# Patient Record
Sex: Male | Born: 1935 | Race: White | Hispanic: No | Smoking: Never smoker
Health system: Southern US, Community
[De-identification: ages and names within clinical notes are randomized; demographics above are authoritative.]

## PROBLEM LIST (undated history)

## (undated) DIAGNOSIS — I519 Heart disease, unspecified: Secondary | ICD-10-CM

## (undated) DIAGNOSIS — N289 Disorder of kidney and ureter, unspecified: Secondary | ICD-10-CM

## (undated) DIAGNOSIS — E785 Hyperlipidemia, unspecified: Secondary | ICD-10-CM

## (undated) DIAGNOSIS — I639 Cerebral infarction, unspecified: Secondary | ICD-10-CM

## (undated) DIAGNOSIS — B019 Varicella without complication: Secondary | ICD-10-CM

## (undated) DIAGNOSIS — J45909 Unspecified asthma, uncomplicated: Secondary | ICD-10-CM

## (undated) DIAGNOSIS — N200 Calculus of kidney: Secondary | ICD-10-CM

## (undated) DIAGNOSIS — E039 Hypothyroidism, unspecified: Secondary | ICD-10-CM

## (undated) DIAGNOSIS — G4730 Sleep apnea, unspecified: Secondary | ICD-10-CM

## (undated) DIAGNOSIS — K8020 Calculus of gallbladder without cholecystitis without obstruction: Secondary | ICD-10-CM

## (undated) DIAGNOSIS — I10 Essential (primary) hypertension: Secondary | ICD-10-CM

## (undated) HISTORY — DX: Heart disease, unspecified: I51.9

## (undated) HISTORY — DX: Calculus of gallbladder without cholecystitis without obstruction: K80.20

## (undated) HISTORY — DX: Unspecified asthma, uncomplicated: J45.909

## (undated) HISTORY — DX: Disorder of kidney and ureter, unspecified: N28.9

## (undated) HISTORY — DX: Sleep apnea, unspecified: G47.30

## (undated) HISTORY — DX: Cerebral infarction, unspecified: I63.9

## (undated) HISTORY — DX: Varicella without complication: B01.9

## (undated) HISTORY — DX: Hyperlipidemia, unspecified: E78.5

## (undated) HISTORY — DX: Hypothyroidism, unspecified: E03.9

## (undated) HISTORY — DX: Essential (primary) hypertension: I10

## (undated) HISTORY — PX: TONSILLECTOMY: SUR1361

## (undated) HISTORY — DX: Calculus of kidney: N20.0

---

## 2003-09-27 ENCOUNTER — Emergency Department (HOSPITAL_COMMUNITY): Admission: EM | Admit: 2003-09-27 | Discharge: 2003-09-27 | Payer: Self-pay | Admitting: Family Medicine

## 2003-11-27 ENCOUNTER — Emergency Department (HOSPITAL_COMMUNITY): Admission: EM | Admit: 2003-11-27 | Discharge: 2003-11-27 | Payer: Self-pay | Admitting: Family Medicine

## 2004-12-17 ENCOUNTER — Emergency Department (HOSPITAL_COMMUNITY): Admission: EM | Admit: 2004-12-17 | Discharge: 2004-12-17 | Payer: Self-pay | Admitting: Emergency Medicine

## 2006-05-30 ENCOUNTER — Emergency Department (HOSPITAL_COMMUNITY): Admission: EM | Admit: 2006-05-30 | Discharge: 2006-05-30 | Payer: Self-pay | Admitting: Family Medicine

## 2006-07-06 ENCOUNTER — Emergency Department (HOSPITAL_COMMUNITY): Admission: EM | Admit: 2006-07-06 | Discharge: 2006-07-06 | Payer: Self-pay | Admitting: Family Medicine

## 2006-07-17 ENCOUNTER — Emergency Department (HOSPITAL_COMMUNITY): Admission: EM | Admit: 2006-07-17 | Discharge: 2006-07-17 | Payer: Self-pay | Admitting: Family Medicine

## 2008-04-15 ENCOUNTER — Emergency Department (HOSPITAL_COMMUNITY): Admission: EM | Admit: 2008-04-15 | Discharge: 2008-04-15 | Payer: Self-pay | Admitting: Emergency Medicine

## 2008-07-13 ENCOUNTER — Emergency Department (HOSPITAL_COMMUNITY): Admission: EM | Admit: 2008-07-13 | Discharge: 2008-07-13 | Payer: Self-pay | Admitting: Emergency Medicine

## 2008-08-23 ENCOUNTER — Emergency Department (HOSPITAL_COMMUNITY): Admission: EM | Admit: 2008-08-23 | Discharge: 2008-08-23 | Payer: Self-pay | Admitting: Family Medicine

## 2008-11-23 ENCOUNTER — Emergency Department (HOSPITAL_COMMUNITY): Admission: EM | Admit: 2008-11-23 | Discharge: 2008-11-23 | Payer: Self-pay | Admitting: Emergency Medicine

## 2010-07-07 LAB — POCT RAPID STREP A (OFFICE): Streptococcus, Group A Screen (Direct): NEGATIVE

## 2010-08-14 NOTE — H&P (Signed)
NAME:  Dale Munoz, Dale Munoz                ACCOUNT NO.:  [id number]   MEDICAL RECORD NO.:  [id number]          PATIENT TYPE:  EMS   LOCATION:  [facility]                         FACILITY:  [facility]   PHYSICIAN:  Dale Munoz, M.D.DATE OF BIRTH:  1924-08-29   DATE OF ADMISSION:  12/17/2004  DATE OF DISCHARGE:                                HISTORY & PHYSICAL   CHIEF COMPLAINT:  Left forearm weakness.   HISTORY OF PRESENTING COMPLAINT:  Dale Munoz is a 77 year old Caucasian male,  __________ elderly; not on an medications.  This morning about 9 a.m. he was  making toast for himself, and while trying to spread peanut butter on the  toast he noticed he was clumsy and his left forearm was not working alright.  He went to Dr. Mellody Munoz office and the patient was sent for MRI.  Results  were called to Dr. Mellody Munoz' office, and the patient was subsequently  informed to come over to the emergency department.  I do not have the full  report of the MRI results at this time.  Attempts at obtaining the results  from Dale Munoz was unsuccessful, as the results have not been dictated.  In any case, my understanding is that he was noted to have acute stroke on  the MRI Munoz, and he was referred to emergency room for further  evaluation.   At this time Dale Munoz is very unwilling to be admitted for full workup of  TIA.  He has regained full strength in his left forearm.  At this point he  does not have any further neurological deficits.   REVIEW OF SYSTEMS:  No chest pain, shortness of breath, headaches, abdominal  pain.   PAST MEDICAL HISTORY:  None.   MEDICATIONS:  None.   ALLERGIES:  PENICILLIN.   FAMILY HISTORY:  No known family history of MI, stroke or diabetes.  Father  died at the age of 2.  Mother died at the age of 26. One brother died at  the age of 20.  He has a younger brother who is alive and healthy.   He is a widower and has 1 daughter that lives in [place].  One son passed  away  in his early 4's from drug overdose.   SOCIAL HISTORY:  He never smoked cigarettes.  Does not drink alcohol  significantly.  He is independent of activities of daily living.   PHYSICAL EXAMINATION:  VITALS:  Blood pressure 152/72, temperature 97.4,  pulse 65, respiratory rate 20, oxygen saturation 97% on room air.  GENERAL:  This is a fairly obese gentleman, in no respiratory distress.  HEENT:  Normocephalic and atraumatic.  Pupils are equal, round and reactive  to light.  Extraocular muscle movements intact.  NECK:  No carotid bruits.  No elevated JVD.  No supraclavicular nodules.  LUNGS:  Clear to auscultation.  CARDIOVASCULAR:  S1 and S2 regular and no murmur, gallop or rub.  ABDOMEN:  Obese, nontender.  No hepatosplenomegaly.  Bowel sounds present.  CNS:  Motor 5/5 in all limbs.  Cranial nerves intact; patient able to  ambulate  without any notable gait.  No cerebellar sign.  SKIN:  No rash.  No petechiae.   LABORATORY DATA:  CBC and differential:  White cells 6.6, hemoglobin 16.5,  hematocrit 46.3, platelets 262; normal differential.  Sodium 138, potassium  3.2, chloride 102, CO2 28, glucose 117, BUN 8, creatinine 0.9.  Bilirubin  0.9, alkaline phosphatase 90, AST 26, ALT 16, total protein 7.0, albumin  3.8, calcium 9.0.  PT 14.9, INR 1.2, PTT 30.  ESR 14.  Cardiac markers at  the point of care are normal.  Urinalysis normal.   EKG:  Normal sinus rhythm, with PR-interval of 158.  Heart rate 67.  QRS  duration 129, mildly prolonged.  There is a question of delta wave on the  QRS complex, which is suggestive of Wolff-Parkinson-White (there is no old  EKG to compare).   ASSESSMENT AND PLAN:  Mr. Dale Munoz is a pleasant 77 year old Caucasian  male, who presents with left forearm weakness and symptoms that have  resolved completed.  MRI reported showed acute stroke.   Dale Munoz is unwilling to be admitted tonight for further workup and risk  evaluation.  He would prefer to  follow up with Dale Munoz in the morning  tomorrow, and have Dale Munoz order necessary workup as indicated by the  full report of the MRI.   He has taken aspirin 1 tablet today, and I strongly advised him to continue  with aspirin once a day.  He will be signing out against medical advice. He  does understand the possible consequences of his actions, which includes  massive stroke within the next 24 hr.  The patient will be seeing Dr.  Doristine Munoz in the morning.      Dale B. Dale Munoz, M.D.  Electronically Signed     Dale Munoz  D:  12/17/2004  T:  12/17/2004  Job:  [id number]

## 2014-04-15 ENCOUNTER — Ambulatory Visit (INDEPENDENT_AMBULATORY_CARE_PROVIDER_SITE_OTHER): Payer: Medicare Other | Admitting: Family Medicine

## 2014-04-15 ENCOUNTER — Encounter: Payer: Self-pay | Admitting: Family Medicine

## 2014-04-15 VITALS — BP 139/55 | HR 61 | Temp 98.3°F | Ht 67.0 in | Wt 172.2 lb

## 2014-04-15 DIAGNOSIS — I6529 Occlusion and stenosis of unspecified carotid artery: Secondary | ICD-10-CM | POA: Insufficient documentation

## 2014-04-15 DIAGNOSIS — G4700 Insomnia, unspecified: Secondary | ICD-10-CM

## 2014-04-15 DIAGNOSIS — R609 Edema, unspecified: Secondary | ICD-10-CM

## 2014-04-15 DIAGNOSIS — I6523 Occlusion and stenosis of bilateral carotid arteries: Secondary | ICD-10-CM

## 2014-04-15 MED ORDER — SUVOREXANT 10 MG PO TABS: 1.0000 | ORAL_TABLET | Freq: Every day | ORAL | Status: DC

## 2014-04-15 NOTE — Progress Notes (Signed)
Pre visit review using our clinic review tool, if applicable. No additional management support is needed unless otherwise documented below in the visit note. 

## 2014-04-15 NOTE — Patient Instructions (Signed)
Insomnia Insomnia is frequent trouble falling and/or staying asleep. Insomnia can be a long term problem or a short term problem. Both are common. Insomnia can be a short term problem when the wakefulness is related to a certain stress or worry. Long term insomnia is often related to ongoing stress during waking hours and/or poor sleeping habits. Overtime, sleep deprivation itself can make the problem worse. Every little thing feels more severe because you are overtired and your ability to cope is decreased. CAUSES   Stress, anxiety, and depression.  Poor sleeping habits.  Distractions such as TV in the bedroom.  Naps close to bedtime.  Engaging in emotionally charged conversations before bed.  Technical reading before sleep.  Alcohol and other sedatives. They may make the problem worse. They can hurt normal sleep patterns and normal dream activity.  Stimulants such as caffeine for several hours prior to bedtime.  Pain syndromes and shortness of breath can cause insomnia.  Exercise late at night.  Changing time zones may cause sleeping problems (jet lag). It is sometimes helpful to have someone observe your sleeping patterns. They should look for periods of not breathing during the night (sleep apnea). They should also look to see how long those periods last. If you live alone or observers are uncertain, you can also be observed at a sleep clinic where your sleep patterns will be professionally monitored. Sleep apnea requires a checkup and treatment. Give your caregivers your medical history. Give your caregivers observations your family has made about your sleep.  SYMPTOMS   Not feeling rested in the morning.  Anxiety and restlessness at bedtime.  Difficulty falling and staying asleep. TREATMENT   Your caregiver may prescribe treatment for an underlying medical disorders. Your caregiver can give advice or help if you are using alcohol or other drugs for self-medication. Treatment  of underlying problems will usually eliminate insomnia problems.  Medications can be prescribed for short time use. They are generally not recommended for lengthy use.  Over-the-counter sleep medicines are not recommended for lengthy use. They can be habit forming.  You can promote easier sleeping by making lifestyle changes such as:  Using relaxation techniques that help with breathing and reduce muscle tension.  Exercising earlier in the day.  Changing your diet and the time of your last meal. No night time snacks.  Establish a regular time to go to bed.  Counseling can help with stressful problems and worry.  Soothing music and white noise may be helpful if there are background noises you cannot remove.  Stop tedious detailed work at least one hour before bedtime. HOME CARE INSTRUCTIONS   Keep a diary. Inform your caregiver about your progress. This includes any medication side effects. See your caregiver regularly. Take note of:  Times when you are asleep.  Times when you are awake during the night.  The quality of your sleep.  How you feel the next day. This information will help your caregiver care for you.  Get out of bed if you are still awake after 15 minutes. Read or do some quiet activity. Keep the lights down. Wait until you feel sleepy and go back to bed.  Keep regular sleeping and waking hours. Avoid naps.  Exercise regularly.  Avoid distractions at bedtime. Distractions include watching television or engaging in any intense or detailed activity like attempting to balance the household checkbook.  Develop a bedtime ritual. Keep a familiar routine of bathing, brushing your teeth, climbing into bed at the same   time each night, listening to soothing music. Routines increase the success of falling to sleep faster.  Use relaxation techniques. This can be using breathing and muscle tension release routines. It can also include visualizing peaceful scenes. You can  also help control troubling or intruding thoughts by keeping your mind occupied with boring or repetitive thoughts like the old concept of counting sheep. You can make it more creative like imagining planting one beautiful flower after another in your backyard garden.  During your day, work to eliminate stress. When this is not possible use some of the previous suggestions to help reduce the anxiety that accompanies stressful situations. MAKE SURE YOU:   Understand these instructions.  Will watch your condition.  Will get help right away if you are not doing well or get worse. Document Released: 03/12/2000 Document Revised: 06/07/2011 Document Reviewed: 04/12/2007 Exit[employer] Patient Information 2015 [employer]. This information is not intended to replace advice given to you by your health care provider. Make sure you discuss any questions you have with your health care provider.  

## 2014-04-15 NOTE — Progress Notes (Signed)
Subjective:    Patient ID: Dale Munoz, male    DOB: [birth year], 81 y.o.   MRN: [id number]  HPI Pt is here with his brother, Franko Hilliker to establish.  He fell in oct and nov and his R shoulder has hurt since.  He is unable to lift his arm past a certain point.  Pt states he did not pass out -- he tripped over his pants .    The second time he tripped over a brick.  He is now walking with a walking stick.  Pt also wants a new sleeping pill. He had been on [place] and ran out.    He is now taking 2 ambien and tylenol pm.  He has not had any work up done and does not want anything done.   He is also refusing referral for shoulder.  He sees [facility].  He will rto for cpe.    Past Medical History  Diagnosis Date  . Asthma   . Chicken pox   . Stroke   . Kidney disease   . Heart disease   . HTN (hypertension)   . Hyperlipemia    History   Social History  . Marital Status: Widowed    Spouse Name: N/A    Number of Children: N/A  . Years of Education: N/A   Occupational History  . Not on file.   Social History Main Topics  . Smoking status: Never Smoker   . Smokeless tobacco: Never Used  . Alcohol Use: No  . Drug Use: No  . Sexual Activity: Not on file   Other Topics Concern  . Not on file   Social History Narrative  . No narrative on file   Current Outpatient Prescriptions  Medication Sig Dispense Refill  . amiodarone (PACERONE) 200 MG tablet Take 100 mg by mouth daily.    Marland Kitchen amLODipine (NORVASC) 10 MG tablet Take 10 mg by mouth daily.    Marland Kitchen aspirin EC 81 MG tablet Take 81 mg by mouth daily.    . Cholecalciferol (VITAMIN D) 2000 UNITS CAPS Take by mouth.    . levothyroxine (SYNTHROID, LEVOTHROID) 50 MCG tablet Take 50 mcg by mouth daily before breakfast.    . Naproxen Sod-Diphenhydramine 220-25 MG TABS Take 2 tablets by mouth daily.    . pravastatin (PRAVACHOL) 40 MG tablet Take 40 mg by mouth daily.    Marland Kitchen torsemide (DEMADEX) 20 MG tablet Take 10 mg by mouth daily.     Marland Kitchen zolpidem (AMBIEN CR) 6.25 MG CR tablet Take 6.25 mg by mouth at bedtime as needed for sleep.    . Suvorexant (BELSOMRA) 10 MG TABS Take 1 tablet by mouth at bedtime. 10 tablet 0  . traZODone (DESYREL) 50 MG tablet Take 0.5-1 tablets (25-50 mg total) by mouth at bedtime as needed for sleep. 30 tablet 0   No current facility-administered medications for this visit.    Review of Systems  Constitutional: Negative for appetite change and fatigue.  HENT: Negative.  Negative for congestion.   Respiratory: Negative for cough, shortness of breath and wheezing.   Cardiovascular: Negative for chest pain and palpitations.  Musculoskeletal: Positive for joint swelling and arthralgias.       + right shoulder pain --- pt refusing referral  Neurological: Negative for tremors, syncope, speech difficulty, weakness and numbness.  Psychiatric/Behavioral: Positive for sleep disturbance. Negative for hallucinations, behavioral problems, confusion, dysphoric mood, decreased concentration and agitation. The patient is not nervous/anxious and is not  hyperactive.        Objective:   Physical Exam BP 139/55 mmHg  Pulse 61  Temp(Src) 98.3 F (36.8 C) (Oral)  Ht 5\' 7"  (1.702 m)  Wt 172 lb 3.2 oz (78.109 kg)  BMI 26.96 kg/m2  SpO2 95% General appearance: alert, cooperative, appears stated age and no distress Ears: normal TM's and external ear canals both ears Nose: Nares normal. Septum midline. Mucosa normal. No drainage or sinus tenderness. Throat: lips, mucosa, and tongue normal; teeth and gums normal Neck: no adenopathy, no JVD, supple, symmetrical, trachea midline, thyroid not enlarged, symmetric, no tenderness/mass/nodules and + carotid bruit  Lungs: clear to auscultation bilaterally Heart: S1, S2 normal Extremities: R shoulder pain -- unable to abduct past 90 degrees        Assessment & Plan:  1. Insomnia Pt willing to try belsomra D/w pt and brother that Remus Lofflerambien was not safe for hi at his  age  372. Edema con't torsemide Elevation F/u card  3. Carotid stenosis, bilateral Per cardiology

## 2014-04-16 ENCOUNTER — Telehealth: Payer: Self-pay | Admitting: Family Medicine

## 2014-04-16 MED ORDER — TRAZODONE HCL 50 MG PO TABS: 25.0000 mg | ORAL_TABLET | Freq: Every evening | ORAL | Status: DC | PRN

## 2014-04-16 NOTE — Telephone Encounter (Signed)
During our discussion yesterday ins did not want him to have ambien--and it is really not safe for his age group  trazadone 50 mg  1/2 -1 po qhs prn #30

## 2014-04-16 NOTE — Telephone Encounter (Signed)
Spoke with patient he verbalized understanding and has agreed to take the Trazadone. Rx faxed.      KP

## 2014-04-16 NOTE — Telephone Encounter (Signed)
Please advise      KP 

## 2014-04-16 NOTE — Telephone Encounter (Signed)
Caller name: Dale Munoz, Dale Munoz Relation to pt: self  Call back number: [phone]  Pharmacy:  [facility] - [place], [place] - [address] [phone] (Phone)     Reason for call:  Pt states Suvorexant (BELSOMRA) 10 MG TABS caused him to have sleepless night and made him delusional, pt requesting zolpidem tartrate 6.25mg . Please advise

## 2014-04-22 ENCOUNTER — Telehealth: Payer: Self-pay | Admitting: Family Medicine

## 2014-04-22 MED ORDER — ZOLPIDEM TARTRATE ER 6.25 MG PO TBCR: 6.2500 mg | EXTENDED_RELEASE_TABLET | Freq: Every evening | ORAL | Status: DC | PRN

## 2014-04-22 NOTE — Telephone Encounter (Signed)
ambien cr 6.25 mg #30  1 po qhs   0 refills We prefer he try not to take one every night--- take every 3-4 nights if possible

## 2014-04-22 NOTE — Telephone Encounter (Signed)
Spoke with patient and he said the Trazodone is not working, he said he has not had a good night sleep for awhile.He has tried tylenol PM with the Trazodone but nothing. He wants to know what to do and he is requesting 6.25 Ambien, he said this is what has worked for him in the past. Please advise      KP

## 2014-04-22 NOTE — Telephone Encounter (Signed)
Patient has been made aware and verbalized understanding, he has agreed to take every 3-4 night as directed. Rx faxed.     KP

## 2014-04-22 NOTE — Telephone Encounter (Signed)
Caller name:Dale Munoz Relationship to patient:self Can be reached:[phone] Pharmacy:[facility]  Reason for call: PT states that RX for sleep is not working- wants to speak with nurse regarding

## 2014-04-22 NOTE — Telephone Encounter (Signed)
Caller name:Danney Lorensen Relationship to patient:self Can be [phone]  Pharmacy: [facility]  Reason for call:PT wanting to speak with nurse regarding RX for sleep- not working.

## 2014-06-07 ENCOUNTER — Telehealth: Payer: Self-pay | Admitting: Family Medicine

## 2014-06-07 NOTE — Telephone Encounter (Signed)
Requesting:  zolpidem (AMBIEN CR) 6.25 MG CR tablet      Contract none UDS none Last OV scheduled CPE 3.17.16 Last Refill 1.25.16 #30 no refills  Please Advise Patient is requesting a 90 day supply be sent to mail order

## 2014-06-07 NOTE — Telephone Encounter (Signed)
Caller name: Allard Relation to VH:QIONpt:self Call back number: [phone] Pharmacy: [facility]  Reason for call:   Patient requesting a refill of zolpidem 6.25mg , pravastatin 40mg , torsemide 20mg , l-tayroxide 50mg , amiodarone 200mg , amlodipine 10mg . 90 day supply

## 2014-06-10 MED ORDER — LEVOTHYROXINE SODIUM 50 MCG PO TABS: 50.0000 ug | ORAL_TABLET | Freq: Every day | ORAL | Status: DC

## 2014-06-10 MED ORDER — AMLODIPINE BESYLATE 10 MG PO TABS: 10.0000 mg | ORAL_TABLET | Freq: Every day | ORAL | Status: DC

## 2014-06-10 MED ORDER — ZOLPIDEM TARTRATE ER 6.25 MG PO TBCR: 6.2500 mg | EXTENDED_RELEASE_TABLET | Freq: Every evening | ORAL | Status: DC | PRN

## 2014-06-10 MED ORDER — TORSEMIDE 20 MG PO TABS: 10.0000 mg | ORAL_TABLET | Freq: Every day | ORAL | Status: DC

## 2014-06-10 MED ORDER — AMIODARONE HCL 200 MG PO TABS: 100.0000 mg | ORAL_TABLET | Freq: Every day | ORAL | Status: DC

## 2014-06-10 MED ORDER — PRAVASTATIN SODIUM 40 MG PO TABS: 40.0000 mg | ORAL_TABLET | Freq: Every day | ORAL | Status: DC

## 2014-06-10 NOTE — Telephone Encounter (Signed)
Patient notified Rx at front desk and that he needs to fill out contract.  All other meds, but Ambien, sent to [facility].

## 2014-06-10 NOTE — Telephone Encounter (Signed)
Printed

## 2014-06-10 NOTE — Telephone Encounter (Signed)
Refill x1-- need uds and contract 

## 2014-06-13 ENCOUNTER — Encounter: Payer: Medicare Other | Admitting: Family Medicine

## 2014-06-19 ENCOUNTER — Telehealth: Payer: Self-pay

## 2014-06-19 NOTE — Telephone Encounter (Signed)
See speciality notes 

## 2014-06-20 ENCOUNTER — Ambulatory Visit (INDEPENDENT_AMBULATORY_CARE_PROVIDER_SITE_OTHER): Payer: Medicare Other | Admitting: Family Medicine

## 2014-06-20 ENCOUNTER — Encounter: Payer: Self-pay | Admitting: Family Medicine

## 2014-06-20 VITALS — BP 122/58 | HR 44 | Temp 97.0°F | Ht 67.0 in | Wt 170.2 lb

## 2014-06-20 DIAGNOSIS — I6523 Occlusion and stenosis of bilateral carotid arteries: Secondary | ICD-10-CM | POA: Diagnosis not present

## 2014-06-20 DIAGNOSIS — G4700 Insomnia, unspecified: Secondary | ICD-10-CM

## 2014-06-20 DIAGNOSIS — R350 Frequency of micturition: Secondary | ICD-10-CM | POA: Diagnosis not present

## 2014-06-20 DIAGNOSIS — I10 Essential (primary) hypertension: Secondary | ICD-10-CM

## 2014-06-20 DIAGNOSIS — E039 Hypothyroidism, unspecified: Secondary | ICD-10-CM

## 2014-06-20 DIAGNOSIS — E785 Hyperlipidemia, unspecified: Secondary | ICD-10-CM | POA: Diagnosis not present

## 2014-06-20 DIAGNOSIS — Z0000 Encounter for general adult medical examination without abnormal findings: Secondary | ICD-10-CM | POA: Diagnosis not present

## 2014-06-20 LAB — HEPATIC FUNCTION PANEL
ALBUMIN: 4 g/dL (ref 3.5–5.2)
ALK PHOS: 241 U/L — AB (ref 39–117)
ALT: 21 U/L (ref 0–53)
AST: 32 U/L (ref 0–37)
Bilirubin, Direct: 0.3 mg/dL (ref 0.0–0.3)
Total Bilirubin: 1 mg/dL (ref 0.2–1.2)
Total Protein: 7.2 g/dL (ref 6.0–8.3)

## 2014-06-20 LAB — CBC WITH DIFFERENTIAL/PLATELET
BASOS PCT: 0.8 % (ref 0.0–3.0)
Basophils Absolute: 0.1 10*3/uL (ref 0.0–0.1)
EOS ABS: 0.4 10*3/uL (ref 0.0–0.7)
Eosinophils Relative: 4.9 % (ref 0.0–5.0)
HEMATOCRIT: 39.2 % (ref 39.0–52.0)
HEMOGLOBIN: 13.2 g/dL (ref 13.0–17.0)
Lymphocytes Relative: 15.9 % (ref 12.0–46.0)
Lymphs Abs: 1.4 10*3/uL (ref 0.7–4.0)
MCHC: 33.7 g/dL (ref 30.0–36.0)
MCV: 95.4 fl (ref 78.0–100.0)
MONO ABS: 0.7 10*3/uL (ref 0.1–1.0)
Monocytes Relative: 8.5 % (ref 3.0–12.0)
NEUTROS ABS: 6.2 10*3/uL (ref 1.4–7.7)
Neutrophils Relative %: 69.9 % (ref 43.0–77.0)
Platelets: 277 10*3/uL (ref 150.0–400.0)
RBC: 4.1 Mil/uL — AB (ref 4.22–5.81)
RDW: 14.2 % (ref 11.5–15.5)
WBC: 8.8 10*3/uL (ref 4.0–10.5)

## 2014-06-20 LAB — BASIC METABOLIC PANEL
BUN: 24 mg/dL — AB (ref 6–23)
CALCIUM: 9.1 mg/dL (ref 8.4–10.5)
CHLORIDE: 102 meq/L (ref 96–112)
CO2: 27 mEq/L (ref 19–32)
Creatinine, Ser: 1.55 mg/dL — ABNORMAL HIGH (ref 0.40–1.50)
GFR: 45.26 mL/min — AB (ref >60.00)
GLUCOSE: 94 mg/dL (ref 70–99)
POTASSIUM: 3.6 meq/L (ref 3.5–5.1)
SODIUM: 138 meq/L (ref 135–145)

## 2014-06-20 LAB — TSH: TSH: 3.6 u[IU]/mL (ref 0.35–4.50)

## 2014-06-20 LAB — LIPID PANEL
Cholesterol: 191 mg/dL (ref 0–200)
HDL: 43.9 mg/dL (ref >39.00)
LDL Cholesterol: 120 mg/dL — ABNORMAL HIGH (ref 0–99)
NONHDL: 147.1
Total CHOL/HDL Ratio: 4
Triglycerides: 137 mg/dL (ref 0.0–149.0)
VLDL: 27.4 mg/dL (ref 0.0–40.0)

## 2014-06-20 LAB — PSA: PSA: 0.59 ng/mL (ref 0.10–4.00)

## 2014-06-20 NOTE — Progress Notes (Signed)
Subjective:   Dale Munoz is a 81 y.o. male who presents for Medicare Annual/Subsequent preventive examination.  Review of Systems:   Review of Systems  Constitutional: Negative for activity change, appetite change and fatigue.  HENT: Negative for, congestion, tinnitus and ear discharge.  + hearing loss but will not use hearing age. Eyes: Negative for visual disturbance (opth-no)  Respiratory: Negative for cough, chest tightness and shortness of breath.   Cardiovascular: Negative for chest pain, palpitations and leg swelling.  Gastrointestinal: Negative for abdominal pain, diarrhea, constipation and abdominal distention.  Genitourinary: Negative for urgency, frequency, decreased urine volume and difficulty urinating.  Musculoskeletal: Negative for back pain, arthralgias and gait problem.  Skin: Negative for color change, pallor and rash.  Neurological: Negative for dizziness, light-headedness, numbness and headaches.  Hematological: Negative for adenopathy. Does not bruise/bleed easily.  Psychiatric/Behavioral: Negative for suicidal ideas, confusion, sleep disturbance, self-injury, dysphoric mood, decreased concentration and agitation.  Pt is able to read and write and can do all ADLs No risk for falling No abuse/ violence in home       Past Medical History  Diagnosis Date  . Asthma   . Chicken pox   . Stroke   . Kidney disease   . Heart disease   . HTN (hypertension)   . Hyperlipemia    History   Social History  . Marital Status: Widowed    Spouse Name: N/A  . Number of Children: N/A  . Years of Education: N/A   Occupational History  . Not on file.   Social History Main Topics  . Smoking status: Never Smoker   . Smokeless tobacco: Never Used  . Alcohol Use: No  . Drug Use: No  . Sexual Activity: Not on file   Other Topics Concern  . Not on file   Social History Narrative   Current Outpatient Prescriptions  Medication Sig Dispense Refill  .  amiodarone (PACERONE) 200 MG tablet Take 0.5 tablets (100 mg total) by mouth daily. 90 tablet 1  . amLODipine (NORVASC) 10 MG tablet Take 1 tablet (10 mg total) by mouth daily. 90 tablet 1  . aspirin EC 81 MG tablet Take 81 mg by mouth daily.    . Cholecalciferol (VITAMIN D) 2000 UNITS CAPS Take by mouth.    . levothyroxine (SYNTHROID, LEVOTHROID) 50 MCG tablet Take 1 tablet (50 mcg total) by mouth daily before breakfast. 90 tablet 1  . pravastatin (PRAVACHOL) 40 MG tablet Take 1 tablet (40 mg total) by mouth daily. 90 tablet 1  . torsemide (DEMADEX) 20 MG tablet Take 0.5 tablets (10 mg total) by mouth daily. 45 tablet 1  . zolpidem (AMBIEN CR) 6.25 MG CR tablet Take 1 tablet (6.25 mg total) by mouth at bedtime as needed for sleep. 90 tablet 0   No current facility-administered medications for this visit.   Not on File History reviewed. No pertinent family history.      Objective:    Vitals: BP 122/58 mmHg  Pulse 44  Temp(Src) 97 F (36.1 C) (Oral)  Ht 5\' 7"  (1.702 m)  Wt 170 lb 3.2 oz (77.202 kg)  BMI 26.65 kg/m2  SpO2 97% BP 122/58 mmHg  Pulse 44  Temp(Src) 97 F (36.1 C) (Oral)  Ht 5\' 7"  (1.702 m)  Wt 170 lb 3.2 oz (77.202 kg)  BMI 26.65 kg/m2  SpO2 97% General appearance: alert, cooperative, appears stated age and no distress Head: Normocephalic, without obvious abnormality, atraumatic Eyes: conjunctivae/corneas clear. PERRL, EOM's  intact. Fundi benign. Ears: normal TM's and external ear canals both ears Nose: Nares normal. Septum midline. Mucosa normal. No drainage or sinus tenderness. Throat: lips, mucosa, and tongue normal; teeth and gums normal Neck: no adenopathy, no carotid bruit, no JVD, supple, symmetrical, trachea midline and thyroid not enlarged, symmetric, no tenderness/mass/nodules Back: symmetric, no curvature. ROM normal. No CVA tenderness. Lungs: clear to auscultation bilaterally Chest wall: no tenderness Heart: S1, S2 normal and + murmur Abdomen: soft,  non-tender; bowel sounds normal; no masses,  no organomegaly Male genitalia: deferred-- pt refused Rectal: deferred--pt refused Extremities: edema +1 pitting edema Low right ext Pulses: 2+ and symmetric Skin: Skin color, texture, turgor normal. No rashes or lesions Lymph nodes: Cervical, supraclavicular, and axillary nodes normal. Neurologic: Alert and oriented X 3, normal strength and tone. Normal symmetric reflexes. Normal coordination and gait Psych- no depression, no anxiety Tobacco History  Smoking status  . Never Smoker   Smokeless tobacco  . Never Used     Counseling given: Not Answered   Past Medical History  Diagnosis Date  . Asthma   . Chicken pox   . Stroke   . Kidney disease   . Heart disease   . HTN (hypertension)   . Hyperlipemia    Past Surgical History  Procedure Laterality Date  . Tonsillectomy     History reviewed. No pertinent family history. History  Sexual Activity  . Sexual Activity: Not on file    Outpatient Encounter Prescriptions as of 06/20/2014  Medication Sig  . amiodarone (PACERONE) 200 MG tablet Take 0.5 tablets (100 mg total) by mouth daily.  Marland Kitchen amLODipine (NORVASC) 10 MG tablet Take 1 tablet (10 mg total) by mouth daily.  Marland Kitchen aspirin EC 81 MG tablet Take 81 mg by mouth daily.  . Cholecalciferol (VITAMIN D) 2000 UNITS CAPS Take by mouth.  . levothyroxine (SYNTHROID, LEVOTHROID) 50 MCG tablet Take 1 tablet (50 mcg total) by mouth daily before breakfast.  . pravastatin (PRAVACHOL) 40 MG tablet Take 1 tablet (40 mg total) by mouth daily.  Marland Kitchen torsemide (DEMADEX) 20 MG tablet Take 0.5 tablets (10 mg total) by mouth daily.  Marland Kitchen zolpidem (AMBIEN CR) 6.25 MG CR tablet Take 1 tablet (6.25 mg total) by mouth at bedtime as needed for sleep.  . [DISCONTINUED] Naproxen Sod-Diphenhydramine 220-25 MG TABS Take 1 tablet by mouth daily.     Activities of Daily Living In your present state of health, do you have any difficulty performing the following  activities: 04/15/2014  Is the patient deaf or have difficulty hearing? Y  Hearing N  Vision Y  Difficulty concentrating or making decisions N  Walking or climbing stairs? N  Doing errands, shopping? N    Patient Care Team: Lelon Perla, DO as PCP - General (Family Medicine)   Assessment:    CPE: Exercise Activities and Dietary recommendations--  Walking daily    Goals    None     Fall Risk Fall Risk  04/15/2014  Falls in the past year? Yes  Number falls in past yr: 2 or more  Risk Factor Category  (No Data)  Risk for fall due to : Impaired balance/gait   Depression Screen PHQ 2/9 Scores 04/15/2014  PHQ - 2 Score 0    Cognitive Testing No flowsheet data found.   There is no immunization history on file for this patient. Screening Tests Health Maintenance  Topic Date Due  . TETANUS/TDAP  01/15/1944  . COLONOSCOPY  01/15/1977  . ZOSTAVAX  01/16/1987  . INFLUENZA VACCINE  04/16/2015 (Originally 10/27/2013)  . PNA vac Low Risk Adult (1 of 2 - PCV13) 06/20/2015 (Originally 01/16/1992)      Plan:    During the course of the visit the patient was educated and counseled about the following appropriate screening and preventive services:   Vaccines to include Pneumoccal, Influenza, Hepatitis B, Td, Zostavax, HCV  Electrocardiogram  Cardiovascular Disease  Colorectal cancer screening  Diabetes screening  Prostate Cancer Screening  Glaucoma screening  Nutrition counseling   Smoking cessation counseling  Patient Instructions (the written plan) was given to the patient.   1. Hypothyroidism, unspecified hypothyroidism type con't synthroid Check labs  2. Hyperlipidemia Check labs con't pravastatin  3. Insomnia Pt has tried mult meds and the only one that works in [employer] aware of risks   4. Preventative health care    Loreen Freud, DO  06/20/2014

## 2014-06-20 NOTE — Patient Instructions (Signed)
Preventive Care for Adults A healthy lifestyle and preventive care can promote health and wellness. Preventive health guidelines for men include the following key practices:  A routine yearly physical is a good way to check with your health care provider about your health and preventative screening. It is a chance to share any concerns and updates on your health and to receive a thorough exam.  Visit your dentist for a routine exam and preventative care every 6 months. Brush your teeth twice a day and floss once a day. Good oral hygiene prevents tooth decay and gum disease.  The frequency of eye exams is based on your age, health, family medical history, use of contact lenses, and other factors. Follow your health care provider's recommendations for frequency of eye exams.  Eat a healthy diet. Foods such as vegetables, fruits, whole grains, low-fat dairy products, and lean protein foods contain the nutrients you need without too many calories. Decrease your intake of foods high in solid fats, added sugars, and salt. Eat the right amount of calories for you.Get information about a proper diet from your health care provider, if necessary.  Regular physical exercise is one of the most important things you can do for your health. Most adults should get at least 150 minutes of moderate-intensity exercise (any activity that increases your heart rate and causes you to sweat) each week. In addition, most adults need muscle-strengthening exercises on 2 or more days a week.  Maintain a healthy weight. The body mass index (BMI) is a screening tool to identify possible weight problems. It provides an estimate of body fat based on height and weight. Your health care provider can find your BMI and can help you achieve or maintain a healthy weight.For adults 20 years and older:  A BMI below 18.5 is considered underweight.  A BMI of 18.5 to 24.9 is normal.  A BMI of 25 to 29.9 is considered overweight.  A BMI  of 30 and above is considered obese.  Maintain normal blood lipids and cholesterol levels by exercising and minimizing your intake of saturated fat. Eat a balanced diet with plenty of fruit and vegetables. Blood tests for lipids and cholesterol should begin at age 50 and be repeated every 5 years. If your lipid or cholesterol levels are high, you are over 50, or you are at high risk for heart disease, you may need your cholesterol levels checked more frequently.Ongoing high lipid and cholesterol levels should be treated with medicines if diet and exercise are not working.  If you smoke, find out from your health care provider how to quit. If you do not use tobacco, do not start.  Lung cancer screening is recommended for adults aged 73-80 years who are at high risk for developing lung cancer because of a history of smoking. A yearly low-dose CT scan of the lungs is recommended for people who have at least a 30-pack-year history of smoking and are a current smoker or have quit within the past 15 years. A pack year of smoking is smoking an average of 1 pack of cigarettes a day for 1 year (for example: 1 pack a day for 30 years or 2 packs a day for 15 years). Yearly screening should continue until the smoker has stopped smoking for at least 15 years. Yearly screening should be stopped for people who develop a health problem that would prevent them from having lung cancer treatment.  If you choose to drink alcohol, do not have more than  2 drinks per day. One drink is considered to be 12 ounces (355 mL) of beer, 5 ounces (148 mL) of wine, or 1.5 ounces (44 mL) of liquor.  Avoid use of street drugs. Do not share needles with anyone. Ask for help if you need support or instructions about stopping the use of drugs.  High blood pressure causes heart disease and increases the risk of stroke. Your blood pressure should be checked at least every 1-2 years. Ongoing high blood pressure should be treated with  medicines, if weight loss and exercise are not effective.  If you are 45-79 years old, ask your health care provider if you should take aspirin to prevent heart disease.  Diabetes screening involves taking a blood sample to check your fasting blood sugar level. This should be done once every 3 years, after age 45, if you are within normal weight and without risk factors for diabetes. Testing should be considered at a younger age or be carried out more frequently if you are overweight and have at least 1 risk factor for diabetes.  Colorectal cancer can be detected and often prevented. Most routine colorectal cancer screening begins at the age of 50 and continues through age 75. However, your health care provider may recommend screening at an earlier age if you have risk factors for colon cancer. On a yearly basis, your health care provider may provide home test kits to check for hidden blood in the stool. Use of a small camera at the end of a tube to directly examine the colon (sigmoidoscopy or colonoscopy) can detect the earliest forms of colorectal cancer. Talk to your health care provider about this at age 50, when routine screening begins. Direct exam of the colon should be repeated every 5-10 years through age 75, unless early forms of precancerous polyps or small growths are found.  People who are at an increased risk for hepatitis B should be screened for this virus. You are considered at high risk for hepatitis B if:  You were born in a country where hepatitis B occurs often. Talk with your health care provider about which countries are considered high risk.  Your parents were born in a high-risk country and you have not received a shot to protect against hepatitis B (hepatitis B vaccine).  You have HIV or AIDS.  You use needles to inject street drugs.  You live with, or have sex with, someone who has hepatitis B.  You are a man who has sex with other men (MSM).  You get hemodialysis  treatment.  You take certain medicines for conditions such as cancer, organ transplantation, and autoimmune conditions.  Hepatitis C blood testing is recommended for all people born from 1945 through 1965 and any individual with known risks for hepatitis C.  Practice safe sex. Use condoms and avoid high-risk sexual practices to reduce the spread of sexually transmitted infections (STIs). STIs include gonorrhea, chlamydia, syphilis, trichomonas, herpes, HPV, and human immunodeficiency virus (HIV). Herpes, HIV, and HPV are viral illnesses that have no cure. They can result in disability, cancer, and death.  If you are at risk of being infected with HIV, it is recommended that you take a prescription medicine daily to prevent HIV infection. This is called preexposure prophylaxis (PrEP). You are considered at risk if:  You are a man who has sex with other men (MSM) and have other risk factors.  You are a heterosexual man, are sexually active, and are at increased risk for HIV infection.    You take drugs by injection.  You are sexually active with a partner who has HIV.  Talk with your health care provider about whether you are at high risk of being infected with HIV. If you choose to begin PrEP, you should first be tested for HIV. You should then be tested every 3 months for as long as you are taking PrEP.  A one-time screening for abdominal aortic aneurysm (AAA) and surgical repair of large AAAs by ultrasound are recommended for men ages 32 to 67 years who are current or former smokers.  Healthy men should no longer receive prostate-specific antigen (PSA) blood tests as part of routine cancer screening. Talk with your health care provider about prostate cancer screening.  Testicular cancer screening is not recommended for adult males who have no symptoms. Screening includes self-exam, a health care provider exam, and other screening tests. Consult with your health care provider about any symptoms  you have or any concerns you have about testicular cancer.  Use sunscreen. Apply sunscreen liberally and repeatedly throughout the day. You should seek shade when your shadow is shorter than you. Protect yourself by wearing long sleeves, pants, a wide-brimmed hat, and sunglasses year round, whenever you are outdoors.  Once a month, do a whole-body skin exam, using a mirror to look at the skin on your back. Tell your health care provider about new moles, moles that have irregular borders, moles that are larger than a pencil eraser, or moles that have changed in shape or color.  Stay current with required vaccines (immunizations).  Influenza vaccine. All adults should be immunized every year.  Tetanus, diphtheria, and acellular pertussis (Td, Tdap) vaccine. An adult who has not previously received Tdap or who does not know his vaccine status should receive 1 dose of Tdap. This initial dose should be followed by tetanus and diphtheria toxoids (Td) booster doses every 10 years. Adults with an unknown or incomplete history of completing a 3-dose immunization series with Td-containing vaccines should begin or complete a primary immunization series including a Tdap dose. Adults should receive a Td booster every 10 years.  Varicella vaccine. An adult without evidence of immunity to varicella should receive 2 doses or a second dose if he has previously received 1 dose.  Human papillomavirus (HPV) vaccine. Males aged 68-21 years who have not received the vaccine previously should receive the 3-dose series. Males aged 22-26 years may be immunized. Immunization is recommended through the age of 6 years for any male who has sex with males and did not get any or all doses earlier. Immunization is recommended for any person with an immunocompromised condition through the age of 49 years if he did not get any or all doses earlier. During the 3-dose series, the second dose should be obtained 4-8 weeks after the first  dose. The third dose should be obtained 24 weeks after the first dose and 16 weeks after the second dose.  Zoster vaccine. One dose is recommended for adults aged 50 years or older unless certain conditions are present.  Measles, mumps, and rubella (MMR) vaccine. Adults born before [birth year] generally are considered immune to measles and mumps. Adults born in [birth year] or later should have 1 or more doses of MMR vaccine unless there is a contraindication to the vaccine or there is laboratory evidence of immunity to each of the three diseases. A routine second dose of MMR vaccine should be obtained at least 28 days after the first dose for students attending postsecondary  schools, health care workers, or international travelers. People who received inactivated measles vaccine or an unknown type of measles vaccine during 1963-1967 should receive 2 doses of MMR vaccine. People who received inactivated mumps vaccine or an unknown type of mumps vaccine before 1979 and are at high risk for mumps infection should consider immunization with 2 doses of MMR vaccine. Unvaccinated health care workers born before 1957 who lack laboratory evidence of measles, mumps, or rubella immunity or laboratory confirmation of disease should consider measles and mumps immunization with 2 doses of MMR vaccine or rubella immunization with 1 dose of MMR vaccine.  Pneumococcal 13-valent conjugate (PCV13) vaccine. When indicated, a person who is uncertain of his immunization history and has no record of immunization should receive the PCV13 vaccine. An adult aged 19 years or older who has certain medical conditions and has not been previously immunized should receive 1 dose of PCV13 vaccine. This PCV13 should be followed with a dose of pneumococcal polysaccharide (PPSV23) vaccine. The PPSV23 vaccine dose should be obtained at least 8 weeks after the dose of PCV13 vaccine. An adult aged 19 years or older who has certain medical conditions and  previously received 1 or more doses of PPSV23 vaccine should receive 1 dose of PCV13. The PCV13 vaccine dose should be obtained 1 or more years after the last PPSV23 vaccine dose.  Pneumococcal polysaccharide (PPSV23) vaccine. When PCV13 is also indicated, PCV13 should be obtained first. All adults aged 65 years and older should be immunized. An adult younger than age 65 years who has certain medical conditions should be immunized. Any person who resides in a nursing home or long-term care facility should be immunized. An adult smoker should be immunized. People with an immunocompromised condition and certain other conditions should receive both PCV13 and PPSV23 vaccines. People with human immunodeficiency virus (HIV) infection should be immunized as soon as possible after diagnosis. Immunization during chemotherapy or radiation therapy should be avoided. Routine use of PPSV23 vaccine is not recommended for American Indians, [place] Natives, or people younger than 65 years unless there are medical conditions that require PPSV23 vaccine. When indicated, people who have unknown immunization and have no record of immunization should receive PPSV23 vaccine. One-time revaccination 5 years after the first dose of PPSV23 is recommended for people aged 19-64 years who have chronic kidney failure, nephrotic syndrome, asplenia, or immunocompromised conditions. People who received 1-2 doses of PPSV23 before age 65 years should receive another dose of PPSV23 vaccine at age 65 years or later if at least 5 years have passed since the previous dose. Doses of PPSV23 are not needed for people immunized with PPSV23 at or after age 65 years.  Meningococcal vaccine. Adults with asplenia or persistent complement component deficiencies should receive 2 doses of quadrivalent meningococcal conjugate (MenACWY-D) vaccine. The doses should be obtained at least 2 months apart. Microbiologists working with certain meningococcal bacteria,  military recruits, people at risk during an outbreak, and people who travel to or live in countries with a high rate of meningitis should be immunized. A first-year college student up through age 21 years who is living in a residence hall should receive a dose if he did not receive a dose on or after his 16th birthday. Adults who have certain high-risk conditions should receive one or more doses of vaccine.  Hepatitis A vaccine. Adults who wish to be protected from this disease, have certain high-risk conditions, work with hepatitis A-infected animals, work in hepatitis A research labs, or   travel to or work in countries with a high rate of hepatitis A should be immunized. Adults who were previously unvaccinated and who anticipate close contact with an international adoptee during the first 60 days after arrival in the [place] from a country with a high rate of hepatitis A should be immunized.  Hepatitis B vaccine. Adults should be immunized if they wish to be protected from this disease, have certain high-risk conditions, may be exposed to blood or other infectious body fluids, are household contacts or sex partners of hepatitis B positive people, are clients or workers in certain care facilities, or travel to or work in countries with a high rate of hepatitis B.  Haemophilus influenzae type b (Hib) vaccine. A previously unvaccinated person with asplenia or sickle cell disease or having a scheduled splenectomy should receive 1 dose of Hib vaccine. Regardless of previous immunization, a recipient of a hematopoietic stem cell transplant should receive a 3-dose series 6-12 months after his successful transplant. Hib vaccine is not recommended for adults with HIV infection. Preventive Service / Frequency Ages 52 to 17  Blood pressure check.** / Every 1 to 2 years.  Lipid and cholesterol check.** / Every 5 years beginning at age 69.  Hepatitis C blood test.** / For any individual with known risks for  hepatitis C.  Skin self-exam. / Monthly.  Influenza vaccine. / Every year.  Tetanus, diphtheria, and acellular pertussis (Tdap, Td) vaccine.** / Consult your health care provider. 1 dose of Td every 10 years.  Varicella vaccine.** / Consult your health care provider.  HPV vaccine. / 3 doses over 6 months, if 72 or younger.  Measles, mumps, rubella (MMR) vaccine.** / You need at least 1 dose of MMR if you were born in 1957 or later. You may also need a second dose.  Pneumococcal 13-valent conjugate (PCV13) vaccine.** / Consult your health care provider.  Pneumococcal polysaccharide (PPSV23) vaccine.** / 1 to 2 doses if you smoke cigarettes or if you have certain conditions.  Meningococcal vaccine.** / 1 dose if you are age 35 to 60 years and a Market researcher living in a residence hall, or have one of several medical conditions. You may also need additional booster doses.  Hepatitis A vaccine.** / Consult your health care provider.  Hepatitis B vaccine.** / Consult your health care provider.  Haemophilus influenzae type b (Hib) vaccine.** / Consult your health care provider. Ages 35 to 8  Blood pressure check.** / Every 1 to 2 years.  Lipid and cholesterol check.** / Every 5 years beginning at age 57.  Lung cancer screening. / Every year if you are aged 44-80 years and have a 30-pack-year history of smoking and currently smoke or have quit within the past 15 years. Yearly screening is stopped once you have quit smoking for at least 15 years or develop a health problem that would prevent you from having lung cancer treatment.  Fecal occult blood test (FOBT) of stool. / Every year beginning at age 55 and continuing until age 73. You may not have to do this test if you get a colonoscopy every 10 years.  Flexible sigmoidoscopy** or colonoscopy.** / Every 5 years for a flexible sigmoidoscopy or every 10 years for a colonoscopy beginning at age 28 and continuing until age  1.  Hepatitis C blood test.** / For all people born from [birth year] through 1965 and any individual with known risks for hepatitis C.  Skin self-exam. / Monthly.  Influenza vaccine. / Every  year.  Tetanus, diphtheria, and acellular pertussis (Tdap/Td) vaccine.** / Consult your health care provider. 1 dose of Td every 10 years.  Varicella vaccine.** / Consult your health care provider.  Zoster vaccine.** / 1 dose for adults aged 53 years or older.  Measles, mumps, rubella (MMR) vaccine.** / You need at least 1 dose of MMR if you were born in 1957 or later. You may also need a second dose.  Pneumococcal 13-valent conjugate (PCV13) vaccine.** / Consult your health care provider.  Pneumococcal polysaccharide (PPSV23) vaccine.** / 1 to 2 doses if you smoke cigarettes or if you have certain conditions.  Meningococcal vaccine.** / Consult your health care provider.  Hepatitis A vaccine.** / Consult your health care provider.  Hepatitis B vaccine.** / Consult your health care provider.  Haemophilus influenzae type b (Hib) vaccine.** / Consult your health care provider. Ages 77 and over  Blood pressure check.** / Every 1 to 2 years.  Lipid and cholesterol check.**/ Every 5 years beginning at age 85.  Lung cancer screening. / Every year if you are aged 55-80 years and have a 30-pack-year history of smoking and currently smoke or have quit within the past 15 years. Yearly screening is stopped once you have quit smoking for at least 15 years or develop a health problem that would prevent you from having lung cancer treatment.  Fecal occult blood test (FOBT) of stool. / Every year beginning at age 33 and continuing until age 11. You may not have to do this test if you get a colonoscopy every 10 years.  Flexible sigmoidoscopy** or colonoscopy.** / Every 5 years for a flexible sigmoidoscopy or every 10 years for a colonoscopy beginning at age 28 and continuing until age 73.  Hepatitis C blood  test.** / For all people born from [birth year] through 1965 and any individual with known risks for hepatitis C.  Abdominal aortic aneurysm (AAA) screening.** / A one-time screening for ages 50 to 27 years who are current or former smokers.  Skin self-exam. / Monthly.  Influenza vaccine. / Every year.  Tetanus, diphtheria, and acellular pertussis (Tdap/Td) vaccine.** / 1 dose of Td every 10 years.  Varicella vaccine.** / Consult your health care provider.  Zoster vaccine.** / 1 dose for adults aged 34 years or older.  Pneumococcal 13-valent conjugate (PCV13) vaccine.** / Consult your health care provider.  Pneumococcal polysaccharide (PPSV23) vaccine.** / 1 dose for all adults aged 63 years and older.  Meningococcal vaccine.** / Consult your health care provider.  Hepatitis A vaccine.** / Consult your health care provider.  Hepatitis B vaccine.** / Consult your health care provider.  Haemophilus influenzae type b (Hib) vaccine.** / Consult your health care provider. **Family history and personal history of risk and conditions may change your health care provider's recommendations. Document Released: 05/11/2001 Document Revised: 03/20/2013 Document Reviewed: 08/10/2010 [facility] Patient Information 2015 [place], [place]. This information is not intended to replace advice given to you by your health care provider. Make sure you discuss any questions you have with your health care provider.

## 2014-06-20 NOTE — Progress Notes (Signed)
Pre visit review using our clinic review tool, if applicable. No additional management support is needed unless otherwise documented below in the visit note. 

## 2014-06-28 ENCOUNTER — Other Ambulatory Visit: Payer: Self-pay

## 2014-06-28 DIAGNOSIS — R7989 Other specified abnormal findings of blood chemistry: Secondary | ICD-10-CM

## 2014-06-28 DIAGNOSIS — R945 Abnormal results of liver function studies: Principal | ICD-10-CM

## 2014-06-28 DIAGNOSIS — M791 Myalgia, unspecified site: Secondary | ICD-10-CM

## 2014-06-28 DIAGNOSIS — IMO0001 Reserved for inherently not codable concepts without codable children: Secondary | ICD-10-CM

## 2014-06-28 DIAGNOSIS — R748 Abnormal levels of other serum enzymes: Secondary | ICD-10-CM

## 2014-06-28 NOTE — Progress Notes (Signed)
Ok to refill-- for some reason I can not use approval button

## 2014-07-01 MED ORDER — ATORVASTATIN CALCIUM 20 MG PO TABS: 20.0000 mg | ORAL_TABLET | Freq: Every day | ORAL | Status: DC

## 2014-07-08 ENCOUNTER — Other Ambulatory Visit (INDEPENDENT_AMBULATORY_CARE_PROVIDER_SITE_OTHER): Payer: Medicare Other

## 2014-07-08 DIAGNOSIS — R945 Abnormal results of liver function studies: Principal | ICD-10-CM

## 2014-07-08 DIAGNOSIS — R7989 Other specified abnormal findings of blood chemistry: Secondary | ICD-10-CM

## 2014-07-08 DIAGNOSIS — R748 Abnormal levels of other serum enzymes: Secondary | ICD-10-CM

## 2014-07-08 LAB — HEPATIC FUNCTION PANEL
ALBUMIN: 3.5 g/dL (ref 3.5–5.2)
ALT: 19 U/L (ref 0–53)
AST: 28 U/L (ref 0–37)
Alkaline Phosphatase: 193 U/L — ABNORMAL HIGH (ref 39–117)
Bilirubin, Direct: 0.2 mg/dL (ref 0.0–0.3)
TOTAL PROTEIN: 6.8 g/dL (ref 6.0–8.3)
Total Bilirubin: 0.7 mg/dL (ref 0.2–1.2)

## 2014-07-08 LAB — GAMMA GT: GGT: 127 U/L — ABNORMAL HIGH (ref 7–51)

## 2014-07-09 ENCOUNTER — Encounter: Payer: Self-pay | Admitting: Family Medicine

## 2014-07-09 DIAGNOSIS — R748 Abnormal levels of other serum enzymes: Secondary | ICD-10-CM

## 2014-07-18 ENCOUNTER — Telehealth: Payer: Self-pay | Admitting: Family Medicine

## 2014-07-18 MED ORDER — TORSEMIDE 20 MG PO TABS: 10.0000 mg | ORAL_TABLET | Freq: Every day | ORAL | Status: DC

## 2014-07-18 NOTE — Telephone Encounter (Signed)
Caller name:Rands, Joseph A Relation to pt: self  Call back number: [phone] [facility] [place] [address] [phone] (Phone) [phone] (Fax)          Reason for call:  Pt requesting a refill torsemide (DEMADEX) 20 MG tablet

## 2014-07-25 ENCOUNTER — Telehealth: Payer: Self-pay | Admitting: Family Medicine

## 2014-07-25 ENCOUNTER — Other Ambulatory Visit (INDEPENDENT_AMBULATORY_CARE_PROVIDER_SITE_OTHER): Payer: Medicare Other

## 2014-07-25 DIAGNOSIS — R748 Abnormal levels of other serum enzymes: Secondary | ICD-10-CM | POA: Diagnosis not present

## 2014-07-25 LAB — HEPATIC FUNCTION PANEL
ALK PHOS: 207 U/L — AB (ref 39–117)
ALT: 20 U/L (ref 0–53)
AST: 29 U/L (ref 0–37)
Albumin: 3.7 g/dL (ref 3.5–5.2)
BILIRUBIN DIRECT: 0.3 mg/dL (ref 0.0–0.3)
TOTAL PROTEIN: 6.6 g/dL (ref 6.0–8.3)
Total Bilirubin: 0.8 mg/dL (ref 0.2–1.2)

## 2014-07-25 LAB — GAMMA GT: GGT: 128 U/L — AB (ref 7–51)

## 2014-07-25 MED ORDER — AMLODIPINE BESYLATE 10 MG PO TABS: 10.0000 mg | ORAL_TABLET | Freq: Every day | ORAL | Status: DC

## 2014-07-25 NOTE — Telephone Encounter (Signed)
Patient has been made aware to continue to hold until we call him and advised to re-start. He verbalized understanding.      KP

## 2014-07-25 NOTE — Telephone Encounter (Signed)
Wait until we get the results

## 2014-07-25 NOTE — Telephone Encounter (Signed)
Notes Recorded by Lelon PerlaYvonne R Lowne, DO on 07/09/2014 at 10:27 AM ggt andliver function could be from Lipitor--- stop Lipitor for 2 weeks Recheck hep and ggt   Please advise      KP

## 2014-07-25 NOTE — Telephone Encounter (Signed)
Caller name:Dale Munoz Relation to AV:WUJWpt:self Call back number:[phone] Pharmacy:  Reason for call: pt states that dr. Laury Axonlowne had informed him not to take his med until he had his labs done. Pt had labs done today and wants to know if he should start the meds now or wait on the results pt was unsure the name of the meds.

## 2014-07-29 ENCOUNTER — Other Ambulatory Visit: Payer: Self-pay

## 2014-07-29 DIAGNOSIS — Z1159 Encounter for screening for other viral diseases: Secondary | ICD-10-CM

## 2014-07-29 DIAGNOSIS — R7989 Other specified abnormal findings of blood chemistry: Secondary | ICD-10-CM

## 2014-07-29 DIAGNOSIS — R945 Abnormal results of liver function studies: Secondary | ICD-10-CM

## 2014-07-30 ENCOUNTER — Telehealth: Payer: Self-pay | Admitting: Family Medicine

## 2014-07-30 MED ORDER — TORSEMIDE 20 MG PO TABS: 10.0000 mg | ORAL_TABLET | Freq: Every day | ORAL | Status: DC

## 2014-07-30 NOTE — Telephone Encounter (Signed)
Caller name: Fabiano Relation to ON:GEXBpt:self Call back number: [phone] Pharmacy:  Reason for call:   Patient states that [facility] told him that he needs Doctors approval to refill torsemide??? Looks like refill has been sent on 07/18/14.

## 2014-07-30 NOTE — Telephone Encounter (Signed)
The Epic system will not take the Dx code for the Acute Hep. Please advise     KP

## 2014-07-30 NOTE — Telephone Encounter (Signed)
°  Relation to pt: self Call back [phone]   Reason for call:  Pt received a all regarding scheduling an appointment pt would like know in details why appointment needs to be made. Please advise

## 2014-07-30 NOTE — Telephone Encounter (Signed)
Patient brother called in and is aware of US appointment for tomorrow morning but is wanting to know if patient needs to do labs??

## 2014-07-30 NOTE — Telephone Encounter (Signed)
Rx re-faxed.

## 2014-07-31 ENCOUNTER — Ambulatory Visit (HOSPITAL_BASED_OUTPATIENT_CLINIC_OR_DEPARTMENT_OTHER)
Admission: RE | Admit: 2014-07-31 | Discharge: 2014-07-31 | Disposition: A | Discharge status: Home / Self Care | Payer: Medicare Other | Source: Ambulatory Visit | Attending: Family Medicine | Admitting: Family Medicine

## 2014-07-31 DIAGNOSIS — R7989 Other specified abnormal findings of blood chemistry: Secondary | ICD-10-CM | POA: Diagnosis present

## 2014-07-31 DIAGNOSIS — I714 Abdominal aortic aneurysm, without rupture: Secondary | ICD-10-CM | POA: Diagnosis not present

## 2014-07-31 DIAGNOSIS — R945 Abnormal results of liver function studies: Secondary | ICD-10-CM

## 2014-07-31 DIAGNOSIS — K8020 Calculus of gallbladder without cholecystitis without obstruction: Secondary | ICD-10-CM | POA: Diagnosis not present

## 2014-07-31 NOTE — Telephone Encounter (Signed)
Do not run---  Any abd pain? If yes-- CT abd

## 2014-08-01 ENCOUNTER — Other Ambulatory Visit: Payer: Self-pay

## 2014-08-01 DIAGNOSIS — K760 Fatty (change of) liver, not elsewhere classified: Secondary | ICD-10-CM

## 2014-08-01 DIAGNOSIS — K769 Liver disease, unspecified: Secondary | ICD-10-CM

## 2014-08-05 ENCOUNTER — Telehealth: Payer: Self-pay | Admitting: Family Medicine

## 2014-08-05 NOTE — Telephone Encounter (Signed)
Needs appt  For labs  (no order in system)

## 2014-08-05 NOTE — Telephone Encounter (Signed)
The patient needs to get a CT scan. I gave him the number to Radiology.      KP

## 2014-08-07 ENCOUNTER — Ambulatory Visit (HOSPITAL_BASED_OUTPATIENT_CLINIC_OR_DEPARTMENT_OTHER)
Admission: RE | Admit: 2014-08-07 | Discharge: 2014-08-07 | Disposition: A | Discharge status: Home / Self Care | Payer: Medicare Other | Source: Ambulatory Visit | Attending: Family Medicine | Admitting: Family Medicine

## 2014-08-07 DIAGNOSIS — K8020 Calculus of gallbladder without cholecystitis without obstruction: Secondary | ICD-10-CM | POA: Insufficient documentation

## 2014-08-07 DIAGNOSIS — K5730 Diverticulosis of large intestine without perforation or abscess without bleeding: Secondary | ICD-10-CM | POA: Insufficient documentation

## 2014-08-07 DIAGNOSIS — I714 Abdominal aortic aneurysm, without rupture: Secondary | ICD-10-CM | POA: Diagnosis not present

## 2014-08-07 DIAGNOSIS — R932 Abnormal findings on diagnostic imaging of liver and biliary tract: Secondary | ICD-10-CM | POA: Insufficient documentation

## 2014-08-07 DIAGNOSIS — K769 Liver disease, unspecified: Secondary | ICD-10-CM

## 2014-08-09 ENCOUNTER — Telehealth: Payer: Self-pay | Admitting: Family Medicine

## 2014-08-09 DIAGNOSIS — K7460 Unspecified cirrhosis of liver: Secondary | ICD-10-CM

## 2014-08-09 NOTE — Telephone Encounter (Signed)
Caller: Bing Reeaul Oxendine, brother Ph#: [phone]  Call back to make referral for liver specialist. Renae Fickleaul has to take Dale Munoz to all appts so he would like to discuss with you prior to referral.

## 2014-08-09 NOTE — Telephone Encounter (Signed)
Rx faxed.    KP 

## 2014-08-12 NOTE — Telephone Encounter (Signed)
Recd call from Ryland GroupPaul Vanroekel. He wanted to follow up on referral. Advised it was placed and he will be contacted with appt.

## 2014-08-13 ENCOUNTER — Encounter: Payer: Self-pay | Admitting: Internal Medicine

## 2014-09-18 ENCOUNTER — Ambulatory Visit (INDEPENDENT_AMBULATORY_CARE_PROVIDER_SITE_OTHER): Payer: Medicare Other | Admitting: Internal Medicine

## 2014-09-18 ENCOUNTER — Encounter: Payer: Self-pay | Admitting: Internal Medicine

## 2014-09-18 VITALS — BP 130/40 | HR 56 | Ht 67.0 in

## 2014-09-18 DIAGNOSIS — R932 Abnormal findings on diagnostic imaging of liver and biliary tract: Secondary | ICD-10-CM

## 2014-09-18 DIAGNOSIS — K8020 Calculus of gallbladder without cholecystitis without obstruction: Secondary | ICD-10-CM | POA: Diagnosis not present

## 2014-09-18 DIAGNOSIS — K7460 Unspecified cirrhosis of liver: Secondary | ICD-10-CM | POA: Diagnosis not present

## 2014-09-18 DIAGNOSIS — R748 Abnormal levels of other serum enzymes: Secondary | ICD-10-CM

## 2014-09-18 DIAGNOSIS — I714 Abdominal aortic aneurysm, without rupture, unspecified: Secondary | ICD-10-CM

## 2014-09-18 DIAGNOSIS — I6523 Occlusion and stenosis of bilateral carotid arteries: Secondary | ICD-10-CM

## 2014-09-18 NOTE — Patient Instructions (Signed)
Please follow up as needed 

## 2014-09-18 NOTE — Progress Notes (Signed)
HISTORY OF PRESENT ILLNESS:  Dale Munoz is a 81 y.o. male with multiple medical problems as listed below. He is sent today for consultation by his primary care provider Dr. Laury Munoz regarding abnormal CT imaging and questionable cirrhosis. The patient is accompanied by his brother who provides is transportation. The patient is a delightful and hard of hearing. Outside office evaluations, x-rays, and laboratories have been reviewed. It appears that the patient underwent routine comprehensive evaluation in March. He underwent multiple laboratories and was found to have elevated alkaline phosphatase with normal hepatic transaminases, bilirubin, protein, and albumen. Also normal CBC including MCV and platelets. Normal thyroid function. Because of the elevated alkaline phosphatase he was sent for an abdominal ultrasound. He was found to have multiple gallstones, heterogeneous hepatic architecture, and distal abdominal aortic aneurysm. Thus, CT scan of the abdomen and pelvis was ordered. This revealed inhomogeneous liver with nodular contours consistent with cirrhosis. There was no evidence for portal hypertension such as collateral veins or varices, or splenomegaly. No ascites. He was also noted to have gallstones. Quite normal bile duct diameter at 2 mm. 2.7 cm abdominal aortic aneurysm noted. Follow-up ultrasound in 5 years for the aneurysm recommended. Incidental colonic diverticulosis. The patient has absolutely no GI symptoms. No abdominal pain. No jaundice, fever, nausea, or vomiting. He is on amiodarone but not sure for how long. No cardiology records. No family history of liver disease. No history of significant alcohol consumption or hepatitis.  REVIEW OF SYSTEMS:  All non-GI ROS negative except for hard of hearing, insomnia  Past Medical History  Diagnosis Date  . Asthma   . Chicken pox   . Stroke   . Kidney disease   . Heart disease   . HTN (hypertension)   . Hyperlipemia   . Hypothyroidism    . Gallstones   . Hypertension   . Kidney stones   . Sleep apnea     Past Surgical History  Procedure Laterality Date  . Tonsillectomy      Social History Dale Munoz  reports that he has never smoked. He has never used smokeless tobacco. He reports that he does not drink alcohol or use illicit drugs.  family history is negative for Colon cancer, Colon polyps, Kidney disease, Diabetes, Esophageal cancer, Heart disease, and Gallbladder disease.  Allergies  Allergen Reactions  . Penicillins Hives and Rash       PHYSICAL EXAMINATION: Vital signs: BP 130/40 mmHg  Pulse 56  Ht  (1.702 m)  Constitutional: Pleasant elderly male, generally well-appearing but somewhat frail, no acute distress Psychiatric: alert and oriented x3, cooperative Eyes: extraocular movements intact, anicteric, conjunctiva pink Mouth: oral pharynx moist, no lesions Neck: supple no lymphadenopathy Cardiovascular: heart regular rate and rhythm, no murmur Lungs: clear to auscultation bilaterally Abdomen: soft, nontender, nondistended, no obvious ascites, no peritoneal signs, normal bowel sounds, no organomegaly Rectal: Omitted Extremities: 1+ lower extremity edema on the right Skin: Multiple ecchymoses on visible extremities Neuro: Hard of hearing. No focal deficits. No asterixis.     ASSESSMENT:  #1. Possible mild and obviously compensated hepatic cirrhosis in an 81 year old. Etiology academic #2. Incidental gallstones at present #3. Small abdominal aortic aneurysm #4. Isolated elevation of alkaline phosphatase with normal liver tests and laboratories otherwise. May be medication related, but would not make changes #5. Multiple medical problems  PLAN:  #1. I reviewed each of the radiographic abnormalities with the patient and his brother. We also discussed the ramifications. We also discussed some areas  where these different entities would be clinically relevant. In terms of his liver, no  particular follow-up required. Managed complication should they arise. Unlikely. In terms of incidental gallstones, the same. As for his abdominal aortic aneurysm, ultrasound in 5 years recommended (should he be alive). #2. Return to the care of Dr. Laury Munoz  A copy of this consultation note has been sent Dr. Laury Munoz

## 2014-09-20 ENCOUNTER — Telehealth: Payer: Self-pay | Admitting: Family Medicine

## 2014-09-20 MED ORDER — ZOLPIDEM TARTRATE ER 6.25 MG PO TBCR: 6.2500 mg | EXTENDED_RELEASE_TABLET | Freq: Every evening | ORAL | Status: AC | PRN

## 2014-09-20 MED ORDER — TORSEMIDE 20 MG PO TABS: 10.0000 mg | ORAL_TABLET | Freq: Every day | ORAL | Status: DC

## 2014-09-20 MED ORDER — TORSEMIDE 20 MG PO TABS: 20.0000 mg | ORAL_TABLET | Freq: Every day | ORAL | Status: DC

## 2014-09-20 MED ORDER — LEVOTHYROXINE SODIUM 50 MCG PO TABS: 50.0000 ug | ORAL_TABLET | Freq: Every day | ORAL | Status: AC

## 2014-09-20 NOTE — Telephone Encounter (Signed)
Ok to refill but he should not be adjusting his own meds.  If he is swelling or getting sob he should be seen

## 2014-09-20 NOTE — Telephone Encounter (Signed)
Last sen 06/20/14 and filled 06/10/14 #90 The pharmacy will on give him 30 pills at a time    Please advise     KP

## 2014-09-20 NOTE — Telephone Encounter (Signed)
Caller name: Antoine Relation to pt: Call back number: [phone] Pharmacy: [facility]  Reason for call:   Requesting zolpidem refill. He is out  Running out torsemide. He states that he is having to take more than expected. Has 12 left.   Needs refill of levothyroxine  Please send all to pharmacy downstairs.

## 2014-09-23 NOTE — Telephone Encounter (Signed)
Spoke with patient and he verbalized understanding, he said that he and Dr.Lowne discussed this issue at his last visit and he was told that he could increase the medication to 1 tab daily due to the swelling during his visit. I asked Dr.Lowne and she said go ahead and fill it for once a day instead of half. Rx faxed.      KP

## 2014-12-27 ENCOUNTER — Other Ambulatory Visit: Payer: Self-pay | Admitting: Family Medicine

## 2014-12-27 NOTE — Telephone Encounter (Signed)
Rx has been called in and [name] has been made aware.     KP

## 2014-12-27 NOTE — Telephone Encounter (Signed)
Refill x1 

## 2014-12-27 NOTE — Telephone Encounter (Signed)
Pharmacy: [facility]  Reason for call: Pt called pharmacy Tuesday 9/27 for refill on zolpidem (AMBIEN CR) 6.25 MG CR tablet. He was informed that we have not responded. Pt needs it today because he only has 1 left.

## 2014-12-27 NOTE — Telephone Encounter (Signed)
Caller name: Lino Wickliff  Relation to pt: brother  Call back number: [phone] Pharmacy: [facility] - [place], [place] - [address] [phone] ([phone] (Fax)         Reason for call:  Brother checking on the status of medication and would like a call when Rx is sent to pharmacy.

## 2014-12-27 NOTE — Telephone Encounter (Signed)
Last seen 06/20/14 and filled 09/20/14 #90   Please advise     KP

## 2014-12-29 ENCOUNTER — Inpatient Hospital Stay (HOSPITAL_COMMUNITY)
Admission: EM | Admit: 2014-12-29 | Discharge: 2015-01-02 | DRG: 280 | Disposition: A | Discharge status: Hospice | Payer: Medicare Other | Attending: Internal Medicine | Admitting: Internal Medicine

## 2014-12-29 ENCOUNTER — Encounter (HOSPITAL_COMMUNITY): Payer: Self-pay | Admitting: *Deleted

## 2014-12-29 ENCOUNTER — Other Ambulatory Visit: Payer: Self-pay

## 2014-12-29 ENCOUNTER — Emergency Department (HOSPITAL_COMMUNITY)
Admit: 2014-12-29 | Discharge: 2014-12-29 | Disposition: A | Discharge status: Home / Self Care | Payer: Medicare Other | Attending: Emergency Medicine | Admitting: Emergency Medicine

## 2014-12-29 ENCOUNTER — Emergency Department (HOSPITAL_COMMUNITY): Payer: Medicare Other

## 2014-12-29 DIAGNOSIS — R778 Other specified abnormalities of plasma proteins: Secondary | ICD-10-CM | POA: Diagnosis present

## 2014-12-29 DIAGNOSIS — G4730 Sleep apnea, unspecified: Secondary | ICD-10-CM | POA: Diagnosis present

## 2014-12-29 DIAGNOSIS — Z7189 Other specified counseling: Secondary | ICD-10-CM

## 2014-12-29 DIAGNOSIS — D72829 Elevated white blood cell count, unspecified: Secondary | ICD-10-CM | POA: Diagnosis present

## 2014-12-29 DIAGNOSIS — N183 Chronic kidney disease, stage 3 (moderate): Secondary | ICD-10-CM | POA: Diagnosis present

## 2014-12-29 DIAGNOSIS — E785 Hyperlipidemia, unspecified: Secondary | ICD-10-CM | POA: Diagnosis present

## 2014-12-29 DIAGNOSIS — I5041 Acute combined systolic (congestive) and diastolic (congestive) heart failure: Principal | ICD-10-CM | POA: Diagnosis present

## 2014-12-29 DIAGNOSIS — K8020 Calculus of gallbladder without cholecystitis without obstruction: Secondary | ICD-10-CM | POA: Diagnosis present

## 2014-12-29 DIAGNOSIS — M7989 Other specified soft tissue disorders: Secondary | ICD-10-CM | POA: Diagnosis not present

## 2014-12-29 DIAGNOSIS — J45909 Unspecified asthma, uncomplicated: Secondary | ICD-10-CM | POA: Diagnosis present

## 2014-12-29 DIAGNOSIS — I214 Non-ST elevation (NSTEMI) myocardial infarction: Secondary | ICD-10-CM | POA: Diagnosis present

## 2014-12-29 DIAGNOSIS — I429 Cardiomyopathy, unspecified: Secondary | ICD-10-CM

## 2014-12-29 DIAGNOSIS — J9601 Acute respiratory failure with hypoxia: Secondary | ICD-10-CM | POA: Diagnosis present

## 2014-12-29 DIAGNOSIS — I509 Heart failure, unspecified: Secondary | ICD-10-CM | POA: Insufficient documentation

## 2014-12-29 DIAGNOSIS — E876 Hypokalemia: Secondary | ICD-10-CM | POA: Diagnosis not present

## 2014-12-29 DIAGNOSIS — E039 Hypothyroidism, unspecified: Secondary | ICD-10-CM | POA: Diagnosis present

## 2014-12-29 DIAGNOSIS — I6529 Occlusion and stenosis of unspecified carotid artery: Secondary | ICD-10-CM | POA: Diagnosis present

## 2014-12-29 DIAGNOSIS — Z791 Long term (current) use of non-steroidal anti-inflammatories (NSAID): Secondary | ICD-10-CM | POA: Diagnosis not present

## 2014-12-29 DIAGNOSIS — I495 Sick sinus syndrome: Secondary | ICD-10-CM | POA: Diagnosis present

## 2014-12-29 DIAGNOSIS — I2510 Atherosclerotic heart disease of native coronary artery without angina pectoris: Secondary | ICD-10-CM | POA: Diagnosis present

## 2014-12-29 DIAGNOSIS — I10 Essential (primary) hypertension: Secondary | ICD-10-CM | POA: Diagnosis present

## 2014-12-29 DIAGNOSIS — I130 Hypertensive heart and chronic kidney disease with heart failure and stage 1 through stage 4 chronic kidney disease, or unspecified chronic kidney disease: Secondary | ICD-10-CM | POA: Diagnosis present

## 2014-12-29 DIAGNOSIS — I471 Supraventricular tachycardia: Secondary | ICD-10-CM | POA: Diagnosis present

## 2014-12-29 DIAGNOSIS — Z880 Allergy status to penicillin: Secondary | ICD-10-CM | POA: Diagnosis not present

## 2014-12-29 DIAGNOSIS — R0602 Shortness of breath: Secondary | ICD-10-CM | POA: Diagnosis present

## 2014-12-29 DIAGNOSIS — Z66 Do not resuscitate: Secondary | ICD-10-CM | POA: Diagnosis present

## 2014-12-29 DIAGNOSIS — I4510 Unspecified right bundle-branch block: Secondary | ICD-10-CM | POA: Diagnosis present

## 2014-12-29 DIAGNOSIS — Z87442 Personal history of urinary calculi: Secondary | ICD-10-CM | POA: Diagnosis not present

## 2014-12-29 DIAGNOSIS — R0600 Dyspnea, unspecified: Secondary | ICD-10-CM

## 2014-12-29 DIAGNOSIS — I5021 Acute systolic (congestive) heart failure: Secondary | ICD-10-CM

## 2014-12-29 DIAGNOSIS — I4719 Other supraventricular tachycardia: Secondary | ICD-10-CM | POA: Diagnosis not present

## 2014-12-29 DIAGNOSIS — Z515 Encounter for palliative care: Secondary | ICD-10-CM

## 2014-12-29 DIAGNOSIS — R7989 Other specified abnormal findings of blood chemistry: Secondary | ICD-10-CM | POA: Diagnosis present

## 2014-12-29 DIAGNOSIS — Z8673 Personal history of transient ischemic attack (TIA), and cerebral infarction without residual deficits: Secondary | ICD-10-CM | POA: Diagnosis not present

## 2014-12-29 DIAGNOSIS — I5189 Other ill-defined heart diseases: Secondary | ICD-10-CM | POA: Diagnosis present

## 2014-12-29 DIAGNOSIS — Z79899 Other long term (current) drug therapy: Secondary | ICD-10-CM | POA: Diagnosis not present

## 2014-12-29 DIAGNOSIS — Z7982 Long term (current) use of aspirin: Secondary | ICD-10-CM

## 2014-12-29 DIAGNOSIS — N179 Acute kidney failure, unspecified: Secondary | ICD-10-CM | POA: Diagnosis present

## 2014-12-29 DIAGNOSIS — I5023 Acute on chronic systolic (congestive) heart failure: Secondary | ICD-10-CM | POA: Diagnosis not present

## 2014-12-29 LAB — BLOOD GAS, ARTERIAL
ACID-BASE EXCESS: 2.7 mmol/L — AB (ref 0.0–2.0)
Acid-Base Excess: 3 mmol/L — ABNORMAL HIGH (ref 0.0–2.0)
BICARBONATE: 26.3 meq/L — AB (ref 20.0–24.0)
Bicarbonate: 25.8 mEq/L — ABNORMAL HIGH (ref 20.0–24.0)
DELIVERY SYSTEMS: POSITIVE
Drawn by: 25700
Drawn by: 235321
EXPIRATORY PAP: 10
FIO2: 1
FIO2: 1
INSPIRATORY PAP: 15
Mode: POSITIVE
O2 SAT: 91 %
O2 Saturation: 96.5 %
PATIENT TEMPERATURE: 98.6
PCO2 ART: 35.5 mmHg (ref 35.0–45.0)
Patient temperature: 97.7
TCO2: 23 mmol/L (ref 0–100)
TCO2: 23.2 mmol/L (ref 0–100)
pCO2 arterial: 36.3 mmHg (ref 35.0–45.0)
pH, Arterial: 7.474 — ABNORMAL HIGH (ref 7.350–7.450)
pH, Arterial: 7.471 — ABNORMAL HIGH (ref 7.350–7.450)
pO2, Arterial: 60 mmHg — ABNORMAL LOW (ref 80.0–100.0)
pO2, Arterial: 84 mmHg (ref 80.0–100.0)

## 2014-12-29 LAB — BASIC METABOLIC PANEL
Anion gap: 12 (ref 5–15)
BUN: 27 mg/dL — AB (ref 6–20)
CO2: 27 mmol/L (ref 22–32)
CREATININE: 1.67 mg/dL — AB (ref 0.61–1.24)
Calcium: 8.8 mg/dL — ABNORMAL LOW (ref 8.9–10.3)
Chloride: 100 mmol/L — ABNORMAL LOW (ref 101–111)
GFR calc Af Amer: 41 mL/min — ABNORMAL LOW (ref >60)
GFR, EST NON AFRICAN AMERICAN: 35 mL/min — AB (ref >60)
Glucose, Bld: 119 mg/dL — ABNORMAL HIGH (ref 65–99)
Potassium: 3.3 mmol/L — ABNORMAL LOW (ref 3.5–5.1)
SODIUM: 139 mmol/L (ref 135–145)

## 2014-12-29 LAB — I-STAT TROPONIN, ED: Troponin i, poc: 0.64 ng/mL (ref 0.00–0.08)

## 2014-12-29 LAB — CBC
HEMATOCRIT: 37.6 % — AB (ref 39.0–52.0)
HEMATOCRIT: 38 % — AB (ref 39.0–52.0)
HEMOGLOBIN: 12.8 g/dL — AB (ref 13.0–17.0)
Hemoglobin: 12.7 g/dL — ABNORMAL LOW (ref 13.0–17.0)
MCH: 32.7 pg (ref 26.0–34.0)
MCH: 32.7 pg (ref 26.0–34.0)
MCHC: 33.8 g/dL (ref 30.0–36.0)
MCHC: 33.7 g/dL (ref 30.0–36.0)
MCV: 96.9 fL (ref 78.0–100.0)
MCV: 96.9 fL (ref 78.0–100.0)
Platelets: 206 10*3/uL (ref 150–400)
Platelets: 215 10*3/uL (ref 150–400)
RBC: 3.88 MIL/uL — ABNORMAL LOW (ref 4.22–5.81)
RBC: 3.92 MIL/uL — AB (ref 4.22–5.81)
RDW: 14.4 % (ref 11.5–15.5)
RDW: 14.3 % (ref 11.5–15.5)
WBC: 13.2 10*3/uL — AB (ref 4.0–10.5)
WBC: 15.2 10*3/uL — AB (ref 4.0–10.5)

## 2014-12-29 LAB — CREATININE, SERUM
Creatinine, Ser: 1.76 mg/dL — ABNORMAL HIGH (ref 0.61–1.24)
GFR calc non Af Amer: 33 mL/min — ABNORMAL LOW (ref >60)
GFR, EST AFRICAN AMERICAN: 38 mL/min — AB (ref >60)

## 2014-12-29 LAB — BRAIN NATRIURETIC PEPTIDE: B Natriuretic Peptide: 1047.3 pg/mL — ABNORMAL HIGH (ref 0.0–100.0)

## 2014-12-29 LAB — TROPONIN I
TROPONIN I: 0.72 ng/mL — AB (ref <0.031)
Troponin I: 0.77 ng/mL (ref <0.031)

## 2014-12-29 MED ORDER — FUROSEMIDE 10 MG/ML IJ SOLN
60.0000 mg | Freq: Three times a day (TID) | INTRAMUSCULAR | Status: DC
  Administered 2014-12-29 – 2014-12-30 (×2): 60 mg via INTRAVENOUS
  Filled 2014-12-29 (×2): qty 6

## 2014-12-29 MED ORDER — ONDANSETRON HCL 4 MG/2ML IJ SOLN: 4.0000 mg | Freq: Four times a day (QID) | INTRAMUSCULAR | Status: DC | PRN

## 2014-12-29 MED ORDER — NITROGLYCERIN 2 % TD OINT
1.0000 [in_us] | TOPICAL_OINTMENT | Freq: Once | TRANSDERMAL | Status: AC
  Administered 2014-12-29: 1 [in_us] via TOPICAL
  Filled 2014-12-29: qty 30

## 2014-12-29 MED ORDER — SODIUM CHLORIDE 0.9 % IJ SOLN: 3.0000 mL | INTRAMUSCULAR | Status: DC | PRN

## 2014-12-29 MED ORDER — BENAZEPRIL HCL 20 MG PO TABS: 20.0000 mg | ORAL_TABLET | Freq: Every day | ORAL | Status: DC

## 2014-12-29 MED ORDER — POTASSIUM CHLORIDE CRYS ER 20 MEQ PO TBCR
40.0000 meq | EXTENDED_RELEASE_TABLET | Freq: Every day | ORAL | Status: DC
  Administered 2014-12-29 – 2014-12-30 (×2): 40 meq via ORAL
  Filled 2014-12-29 (×2): qty 2

## 2014-12-29 MED ORDER — SODIUM CHLORIDE 0.9 % IJ SOLN
3.0000 mL | Freq: Two times a day (BID) | INTRAMUSCULAR | Status: DC
  Administered 2014-12-29 – 2014-12-30 (×2): 3 mL via INTRAVENOUS

## 2014-12-29 MED ORDER — LEVOTHYROXINE SODIUM 50 MCG PO TABS
50.0000 ug | ORAL_TABLET | Freq: Every day | ORAL | Status: DC
  Administered 2014-12-30 – 2015-01-01 (×3): 50 ug via ORAL
  Filled 2014-12-29 (×3): qty 1

## 2014-12-29 MED ORDER — ACETAMINOPHEN 325 MG PO TABS: 650.0000 mg | ORAL_TABLET | Freq: Four times a day (QID) | ORAL | Status: DC | PRN

## 2014-12-29 MED ORDER — AMLODIPINE BESYLATE 5 MG PO TABS
5.0000 mg | ORAL_TABLET | Freq: Every day | ORAL | Status: DC
  Administered 2014-12-30: 5 mg via ORAL
  Filled 2014-12-29: qty 1

## 2014-12-29 MED ORDER — CHLORHEXIDINE GLUCONATE 0.12 % MT SOLN
15.0000 mL | Freq: Two times a day (BID) | OROMUCOSAL | Status: DC
  Administered 2014-12-30 – 2015-01-02 (×7): 15 mL via OROMUCOSAL
  Filled 2014-12-29 (×6): qty 15

## 2014-12-29 MED ORDER — AMIODARONE HCL 200 MG PO TABS
100.0000 mg | ORAL_TABLET | Freq: Every day | ORAL | Status: DC
  Administered 2014-12-30: 100 mg via ORAL
  Filled 2014-12-29: qty 1

## 2014-12-29 MED ORDER — HEPARIN SODIUM (PORCINE) 5000 UNIT/ML IJ SOLN
5000.0000 [IU] | Freq: Three times a day (TID) | INTRAMUSCULAR | Status: DC
  Administered 2014-12-29 – 2014-12-30 (×3): 5000 [IU] via SUBCUTANEOUS
  Filled 2014-12-29 (×4): qty 1

## 2014-12-29 MED ORDER — ZOLPIDEM TARTRATE 5 MG PO TABS
5.0000 mg | ORAL_TABLET | Freq: Every evening | ORAL | Status: DC | PRN
  Administered 2014-12-31 – 2015-01-01 (×2): 5 mg via ORAL
  Filled 2014-12-29 (×2): qty 1

## 2014-12-29 MED ORDER — ACETAMINOPHEN 325 MG PO TABS: 650.0000 mg | ORAL_TABLET | ORAL | Status: DC | PRN

## 2014-12-29 MED ORDER — FUROSEMIDE 10 MG/ML IJ SOLN
60.0000 mg | Freq: Once | INTRAMUSCULAR | Status: AC
  Administered 2014-12-29: 60 mg via INTRAVENOUS
  Filled 2014-12-29: qty 8

## 2014-12-29 MED ORDER — ASPIRIN EC 81 MG PO TBEC
81.0000 mg | DELAYED_RELEASE_TABLET | Freq: Every day | ORAL | Status: DC
  Administered 2014-12-30: 81 mg via ORAL
  Filled 2014-12-29: qty 1

## 2014-12-29 MED ORDER — SODIUM CHLORIDE 0.9 % IV SOLN: 250.0000 mL | INTRAVENOUS | Status: DC | PRN

## 2014-12-29 MED ORDER — AMLODIPINE BESY-BENAZEPRIL HCL 5-20 MG PO CAPS: 1.0000 | ORAL_CAPSULE | Freq: Every day | ORAL | Status: DC

## 2014-12-29 MED ORDER — CETYLPYRIDINIUM CHLORIDE 0.05 % MT LIQD
7.0000 mL | Freq: Two times a day (BID) | OROMUCOSAL | Status: DC
Start: 2014-12-30 — End: 2015-01-02
  Administered 2014-12-30 – 2015-01-01 (×4): 7 mL via OROMUCOSAL

## 2014-12-29 NOTE — Progress Notes (Signed)
Pt has his wallet at his bedside in the room, wallet contained $34 in $20+($5x2)+($1x4) and some credit cards and a driver licence ID. pt declined for wallet to be put in a safe.

## 2014-12-29 NOTE — Progress Notes (Signed)
At 2000, Pt oxygen saturation 82% on 100% NRB, bilateral lungs  with fine crackles and rhonchi, pt with increased WOB. RT notified to assist with pt. Pt pulled up in bed and 2200 dose of Lasix given at 2001. Pt oxygen saturation increased to 85-88% and WOB decreased. Pt states " I can breath a little better."   Around 2030 pt oxygen saturation decreased to 82-85% on 100% NRB with an increase WOB. RT recommended pt be transferred to SD and be placed on Bipap.  Lenny Pastel, NP notified and ordered for pt to transfer to SD placed. Pt transferred to room 1223. Bedside report given to Merril Abbe, Charity fundraiser. Pt brother, Marsh Heckler notified of pt transfer per pt request.

## 2014-12-29 NOTE — ED Notes (Signed)
Pt remains on non-rebreather and monitor.

## 2014-12-29 NOTE — H&P (Signed)
[facility] History and Physical     History and Physical:    Dale Munoz   ZOX:[id number] DOB: 11-08-1924 DOA: 12/29/2014  Referring MD/provider: Dr. Lynelle Doctor PCP: Loreen Freud, DO   Chief Complaint: Orthopnea  History of Present Illness:   Dale Munoz is an 81 y.o. male with past medical history of coronary artery disease unknown, heart failure, essential hypertension that comes in for orthopnea that started the day prior to admission. He relates that every time he would try to go to sleep he will feel short of breath and had to sit up in a chair. He relates nothing makes it better or worse. He hasn't noticed shortness of breath on exertion. He denies any chest pain, nausea, denies fever, palpitations or sweating.  In the ED: He was found to be hypoxic satting at 88%, hypokalemic with mild acute renal failure,BNP and mild elevation in troponin of 0.6 with a chest x-ray shows mild pulmonary edema, mild leukocytosis.   ROS:   ROS  Constitutional: No fever, no chills;  Appetite normal; No weight loss, no weight gain, no fatigue.   HEENT: No blurry vision, no diplopia, no pharyngitis, no dysphagia  CV: No chest pain, no palpitations, no PND, no orthopnea, no edema.   Resp: , no cough, no pleuritic pain.  GI: No nausea, no vomiting, no diarrhea, no melena, no hematochezia, no constipation, no abdominal pain.   GU: No dysuria, no hematuria, no frequency, no urgency.  MSK: No myalgias, no arthralgias.   Neuro:  No headache, no focal neurological deficits, no history of seizures.   Psych: No depression, no anxiety.   Endo: No heat intolerance, no cold intolerance, no polyuria, no polydipsia   Skin: No rashes, no skin lesions.   Heme: No easy bruising.   Travel history: No recent travel.   Past Medical History:   Past Medical History  Diagnosis Date  . Asthma   . Chicken pox   . Stroke (HCC)   . Kidney disease   . Heart disease   . HTN (hypertension)   .  Hyperlipemia   . Hypothyroidism   . Gallstones   . Hypertension   . Kidney stones   . Sleep apnea     Past Surgical History:   Past Surgical History  Procedure Laterality Date  . Tonsillectomy      Social History:   Social History   Social History  . Marital Status: Widowed    Spouse Name: N/A  . Number of Children: 2  . Years of Education: N/A   Occupational History  . Aviation    Social History Main Topics  . Smoking status: Never Smoker   . Smokeless tobacco: Never Used  . Alcohol Use: No  . Drug Use: No  . Sexual Activity: No   Other Topics Concern  . Not on file   Social History Narrative    Family history:   Family History  Problem Relation Age of Onset  . Colon cancer Neg Hx   . Colon polyps Neg Hx   . Kidney disease Neg Hx   . Diabetes Neg Hx   . Esophageal cancer Neg Hx   . Heart disease Neg Hx   . Gallbladder disease Neg Hx   . Sudden death Mother   . Sudden death Father     Allergies   Penicillins  Current Medications:   Prior to Admission medications   Medication Sig Start Date End Date Taking? Authorizing Provider  amiodarone (  PACERONE) 200 MG tablet Take 0.5 tablets (100 mg total) by mouth daily. Patient taking differently: Take 200 mg by mouth daily.  06/10/14  Yes Yvonne R Lowne, DO  amLODipine-benazepril (LOTREL) 5-20 MG capsule Take 1 capsule by mouth daily.   Yes Historical Provider, MD  aspirin EC 81 MG tablet Take 81 mg by mouth daily.   Yes Historical Provider, MD  Cholecalciferol (VITAMIN D) 2000 UNITS CAPS Take by mouth.   Yes Historical Provider, MD  levothyroxine (SYNTHROID, LEVOTHROID) 50 MCG tablet Take 1 tablet (50 mcg total) by mouth daily before breakfast. 09/20/14  Yes Grayling Congress Lowne, DO  naproxen sodium (ANAPROX) 220 MG tablet Take 220 mg by mouth 2 (two) times daily as needed (shoulder pain).   Yes Historical Provider, MD  torsemide (DEMADEX) 20 MG tablet Take 1 tablet (20 mg total) by mouth daily. Dose change. D/C  PREVIOUS SCRIPTS FOR THIS MEDICATION 09/20/14  Yes Yvonne R Lowne, DO  zolpidem (AMBIEN CR) 6.25 MG CR tablet Take 1 tablet (6.25 mg total) by mouth at bedtime as needed for sleep. 09/20/14  Yes Yvonne R Lowne, DO  amLODipine (NORVASC) 10 MG tablet Take 1 tablet (10 mg total) by mouth daily. 07/25/14   Lelon Perla, DO    Physical Exam:   Filed Vitals:   12/29/14 1155 12/29/14 1200 12/29/14 1230 12/29/14 1300  BP:  135/57 136/50 134/69  Pulse:  79 80 118  Temp:      TempSrc:      Resp:  Height:      Weight:      SpO2: 86% 88% 93% 92%     Physical Exam: Blood pressure 134/69, pulse 118, temperature 97.7 F (36.5 C), temperature source Oral, resp. rate 27, height  (1.702 m), weight 81.194 kg (179 lb), SpO2 92 %. Gen: No acute distress. Disheveled Head: Normocephalic, atraumatic. Eyes: PERRL, EOMI, sclerae nonicteric. Mouth: Oropharynx Neck: Supple, JVD with positive hepatojugular reflux Chest: Has bruises on the right shoulder from a fall, bilateral crackers in both lung fields CV: Regular rate and rhythm with positive S1-S2 no appreciated murmurs rubs gallops Abdomen: Soft, nontender, nondistended with normal active bowel sounds. Extremities: Extremities Skin: Warm and dry. Neuro: Alert and oriented times3; cranial nerves II through XII grossly intact. Psych: Mood and affect normal.   Data Review:    Labs: Basic Metabolic Panel:  Recent Labs Lab 12/29/14 1211  NA 139  K 3.3*  CL 100*  CO2 27  GLUCOSE 119*  BUN 27*  CREATININE 1.67*  CALCIUM 8.8*   Liver Function Tests: No results for input(s): AST, ALT, ALKPHOS, BILITOT, PROT, ALBUMIN in the last 168 hours. No results for input(s): LIPASE, AMYLASE in the last 168 hours. No results for input(s): AMMONIA in the last 168 hours. CBC:  Recent Labs Lab 12/29/14 1211  WBC 13.2*  HGB 12.7*  HCT 37.6*  MCV 96.9  PLT 206   Cardiac Enzymes: No results for input(s): CKTOTAL, CKMB, CKMBINDEX,  TROPONINI in the last 168 hours.  BNP (last 3 results) No results for input(s): PROBNP in the last 8760 hours. CBG: No results for input(s): GLUCAP in the last 168 hours.  Radiographic Studies: Dg Chest Port 1 View  12/29/2014   CLINICAL DATA:  SOB since last p.m. and bilateral crackles on exam. Pt was seen at [facility] for broken clavicle/shoulder last Friday and was given an arm sling. H/o asthma, stroke, HTN. Nonsmoker.  EXAM: PORTABLE CHEST - 1 VIEW  COMPARISON:  06/03/2009  FINDINGS: Airspace opacities in both lung bases left worse than right. Stable cardiomegaly. Tortuous thoracic aorta. Perihilar interstitial prominence may represent congestion or infiltrate. Septal lines suggested peripherally on the right. No definite effusion.  Degenerative changes in the right shoulder and bilateral AC joints.  IMPRESSION: 1. Bilateral interstitial edema or infiltrates with asymmetric basilar airspace disease, left worse than right.   Electronically Signed   By: Corlis Leak M.D.   On: 12/29/2014 12:58   *I have personally reviewed the images above*  EKG: Independently reviewed. Sinus rhythm, with a prolonged PR, keep diffuse T-wave inversions prolonged QT and LVH   Assessment/Plan:   Acute systolic CHF (congestive heart failure), NYHA class 2 (HCC)/Acute respiratory failure with hypoxia (HCC): - She had been consuming ibuprofen and naproxen which is probably causing his acute decompensated heart failure. His BNP is elevated he has not elevation in troponins, chest x-ray shows small pulmonary edema, he has JVD on physical exam no lower extremity. - I agree with starting him on IV Lasix and monitor strict I's and O's daily weights. He is on an ACE inhibitor I am not sure why he is not on a beta blocker will try to obtain records from his cardiologist. - Restrict her fluids, given the low sodium diet. - Cycle troponins.  Hypothyroidism: Continue Synthroid  Hyperlipidemia Continue statins     Hypokalemia: Replete orally. Recheck in the morning.  Essential hypertension: DC Norvasc, continue all other antihypertensive medications.  DVT prophylaxis Heparin Code Status: Full. Family Communication: brother Disposition Plan: Home when stable.  Time spent: 65 min  Marinda Elk [facility] Pager [phone]  If 7PM-7AM, please contact night-coverage [email or web address] Password [email or web address] 12/29/2014, 2:18 PM

## 2014-12-29 NOTE — ED Notes (Signed)
Respiratory called for ABG

## 2014-12-29 NOTE — Progress Notes (Signed)
CRITICAL VALUE ALERT  Critical value received: trop-0.72  Date of notification: 12/29/2014  Time of notification: 1800  Critical value read back:yes  Nurse who received alert: Jeryl Columbia  MD notified (1st page): Dr. Robb Matar  Time of first page: 1805  MD notified (2nd page):  Time of second page:  Responding MD: pending  Time MD responded: awaiting call back

## 2014-12-29 NOTE — ED Notes (Signed)
Bed: WA03 Expected date:  Expected time:  Means of arrival:  Comments: 

## 2014-12-29 NOTE — ED Provider Notes (Signed)
CSN: [id number]     Arrival date & time 12/29/14  1120 History   First MD Initiated Contact with Patient 12/29/14 1130     Chief Complaint  Patient presents with  . Shortness of Breath   HPI Patient presents to the emergency room for evaluation of shortness of breath. Symptoms started last night. He noticed that he was not able to sleep well. He was constantly having to get up and sit upright. He has not been coughing. He denies any chest pain. He noticed that when he was lying flat he was having difficulty with his breathing. This morning the symptoms have persisted. He noticed swelling in both legs, although more so on the right leg. Right leg swelling is not new however any states that he had an ultrasound maybe for 5 months ago at the [place] to make sure he did not have a blood clot. He denies any history of PE or DVT. He denies any history of congestive heart failure.  Patient incidentally had a recent fall. He was evaluated at the [place] this past week and had x-rays that demonstrated a fractured collarbone. The patient is supposed to be wearing a sling but he has not needed it.  Past Medical History  Diagnosis Date  . Asthma   . Chicken pox   . Stroke (HCC)   . Kidney disease   . Heart disease   . HTN (hypertension)   . Hyperlipemia   . Hypothyroidism   . Gallstones   . Hypertension   . Kidney stones   . Sleep apnea    Past Surgical History  Procedure Laterality Date  . Tonsillectomy     Family History  Problem Relation Age of Onset  . Colon cancer Neg Hx   . Colon polyps Neg Hx   . Kidney disease Neg Hx   . Diabetes Neg Hx   . Esophageal cancer Neg Hx   . Heart disease Neg Hx   . Gallbladder disease Neg Hx    Social History  Substance Use Topics  . Smoking status: Never Smoker   . Smokeless tobacco: Never Used  . Alcohol Use: No    Review of Systems  All other systems reviewed and are negative.     Allergies  Penicillins  Home Medications   Prior to Admission  medications   Medication Sig Start Date End Date Taking? Authorizing Provider  amiodarone (PACERONE) 200 MG tablet Take 0.5 tablets (100 mg total) by mouth daily. Patient taking differently: Take 200 mg by mouth daily.  06/10/14  Yes Yvonne R Lowne, DO  amLODipine-benazepril (LOTREL) 5-20 MG capsule Take 1 capsule by mouth daily.   Yes Historical Provider, MD  aspirin EC 81 MG tablet Take 81 mg by mouth daily.   Yes Historical Provider, MD  Cholecalciferol (VITAMIN D) 2000 UNITS CAPS Take by mouth.   Yes Historical Provider, MD  levothyroxine (SYNTHROID, LEVOTHROID) 50 MCG tablet Take 1 tablet (50 mcg total) by mouth daily before breakfast. 09/20/14  Yes Grayling Congress Lowne, DO  naproxen sodium (ANAPROX) 220 MG tablet Take 220 mg by mouth 2 (two) times daily as needed (shoulder pain).   Yes Historical Provider, MD  torsemide (DEMADEX) 20 MG tablet Take 1 tablet (20 mg total) by mouth daily. Dose change. D/C PREVIOUS SCRIPTS FOR THIS MEDICATION 09/20/14  Yes Yvonne R Lowne, DO  zolpidem (AMBIEN CR) 6.25 MG CR tablet Take 1 tablet (6.25 mg total) by mouth at bedtime as needed for sleep. 09/20/14  Yes Grayling Congress Lowne, DO  amLODipine (NORVASC) 10 MG tablet Take 1 tablet (10 mg total) by mouth daily. 07/25/14   Yvonne R Lowne, DO   BP 134/69 mmHg  Pulse 118  Temp(Src) 97.7 F (36.5 C) (Oral)  Resp 27  Ht  (1.702 m)  Wt 179 lb (81.194 kg)  BMI 28.03 kg/m2  SpO2 92% Physical Exam  Constitutional: He appears well-developed and well-nourished. No distress.  HENT:  Head: Normocephalic and atraumatic.  Right Ear: External ear normal.  Left Ear: External ear normal.  Eyes: Conjunctivae are normal. Right eye exhibits no discharge. Left eye exhibits no discharge. No scleral icterus.  Neck: Neck supple. No tracheal deviation present.  Cardiovascular: Normal rate, regular rhythm and intact distal pulses.   Pulmonary/Chest: Effort normal. No stridor. No respiratory distress. He has no wheezes. He has rales  in the right upper field, the right middle field, the right lower field, the left upper field, the left middle field and the left lower field.  Abdominal: Soft. Bowel sounds are normal. He exhibits no distension. There is no tenderness. There is no rebound and no guarding.  Musculoskeletal: He exhibits edema.       Right shoulder: He exhibits decreased range of motion and tenderness.  Bruising of the anterior chest wall and along the distal clavicle, tenderness palpation distal right clavicle ; asymmetric edema of bilateral lower extremities with the right greater than the left, no erythema or tenderness  Neurological: He is alert. He has normal strength. No cranial nerve deficit (no facial droop, extraocular movements intact, no slurred speech) or sensory deficit. He exhibits normal muscle tone. He displays no seizure activity. Coordination normal.  Skin: Skin is warm and dry. No rash noted.  Psychiatric: He has a normal mood and affect.  Nursing note and vitals reviewed.   ED Course  Procedures (including critical care time)  CRITICAL CARE Performed by: ZOXWR,UEA Total critical care time: 35 Critical care time was exclusive of separately billable procedures and treating other patients. Critical care was necessary to treat or prevent imminent or life-threatening deterioration. Critical care was time spent personally by me on the following activities: development of treatment plan with patient and/or surrogate as well as nursing, discussions with consultants, evaluation of patient's response to treatment, examination of patient, obtaining history from patient or surrogate, ordering and performing treatments and interventions, ordering and review of laboratory studies, ordering and review of radiographic studies, pulse oximetry and re-evaluation of patient's condition.  Labs Review Labs Reviewed  BASIC METABOLIC PANEL - Abnormal; Notable for the following:    Potassium 3.3 (*)    Chloride 100  (*)    Glucose, Bld 119 (*)    BUN 27 (*)    Creatinine, Ser 1.67 (*)    Calcium 8.8 (*)    GFR calc non Af Amer 35 (*)    GFR calc Af Amer 41 (*)    All other components within normal limits  CBC - Abnormal; Notable for the following:    WBC 13.2 (*)    RBC 3.88 (*)    Hemoglobin 12.7 (*)    HCT 37.6 (*)    All other components within normal limits  BRAIN NATRIURETIC PEPTIDE - Abnormal; Notable for the following:    B Natriuretic Peptide 1047.3 (*)    All other components within normal limits  BLOOD GAS, ARTERIAL - Abnormal; Notable for the following:    pH, Arterial 7.474 (*)    pO2, Arterial 60.0 (*)  Bicarbonate 25.8 (*)    Acid-Base Excess 2.7 (*)    All other components within normal limits  I-STAT TROPOININ, ED - Abnormal; Notable for the following:    Troponin i, poc 0.64 (*)    All other components within normal limits    Imaging Review Dg Chest Port 1 View  12/29/2014   CLINICAL DATA:  SOB since last p.m. and bilateral crackles on exam. Pt was seen at [facility] for broken clavicle/shoulder last Friday and was given an arm sling. H/o asthma, stroke, HTN. Nonsmoker.  EXAM: PORTABLE CHEST - 1 VIEW  COMPARISON:  06/03/2009  FINDINGS: Airspace opacities in both lung bases left worse than right. Stable cardiomegaly. Tortuous thoracic aorta. Perihilar interstitial prominence may represent congestion or infiltrate. Septal lines suggested peripherally on the right. No definite effusion.  Degenerative changes in the right shoulder and bilateral AC joints.  IMPRESSION: 1. Bilateral interstitial edema or infiltrates with asymmetric basilar airspace disease, left worse than right.   Electronically Signed   By: Corlis Leak M.D.   On: 12/29/2014 12:58   I have personally reviewed and evaluated these images and lab results as part of my medical decision-making.   EKG Interpretation   Date/Time:  Sunday December 29 2014 11:55:09 EDT Ventricular Rate:  84 PR Interval:  34 QRS Duration:  188 QT Interval:  504 QTC Calculation: 596 R Axis:   -69 Text Interpretation:  Poor data quality, ?sinus rhythm Short PR interval  Right bundle branch block LVH with secondary repolarization abnormality No  old tracing to compare Confirmed by Trellis Guirguis  MD-J, Lorien Shingler ([id number]) on 12/29/2014  12:03:33 PM    Able to review an EKG in 2015 at [facility].  RBBB present on that EKG  Medications  furosemide (LASIX) injection 60 mg (not administered)  nitroGLYCERIN (NITROGLYN) 2 % ointment 1 inch (not administered)     MDM   Final diagnoses:  Acute on chronic congestive heart failure, unspecified congestive heart failure type (HCC)    The patient's laboratory testing and chest x-ray consistent with acute congestive heart failure. He isn't hypoxic but in no distress. He is improved with oxygen supplementation Patient does have slight increase in his troponin.  We will need to monitor for signs of non-ST elevation MI. Patient has never had any chest pain.  Patient was given a dose of diarrhetic's and nitroglycerin. I will consult the medical service for admission and further treatment.   Linwood Dibbles, MD 12/29/14 1345

## 2014-12-29 NOTE — Progress Notes (Signed)
VASCULAR LAB PRELIMINARY  PRELIMINARY  PRELIMINARY  PRELIMINARY  Right lower extremity venous duplex completed.    Preliminary report:  There is no DVT or SVT noted in the right lower extremity.  There is a small to moderate sized, intact Baker's cyst, noted in the right popliteal fossa.  Shray Hunley, RVT 12/29/2014, 1:35 PM

## 2014-12-29 NOTE — ED Notes (Signed)
Bed: WA06 Expected date:  Expected time:  Means of arrival:  Comments: 

## 2014-12-29 NOTE — ED Notes (Signed)
Portable CXR completed.  Placed placed on non-rebreather d/t pt on 5 L/[place] and O2 sats never above 86%.

## 2014-12-29 NOTE — ED Notes (Signed)
Brother states "he was seen at the [place] on Friday for his right shoulder.  He has a broken clavicle and another bone.  They gave him an arm sling.  Last night he started having shortness of breath."  Pt presents with bruising to right shoulder area, edema to bilat LE's.  Pt placed on oxygen @ 2.5 L/[place] post RA sats.  Pt is A&O x 3.  Pt states "I take a fluid pill."

## 2014-12-29 NOTE — Progress Notes (Signed)
eLink Physician-Brief Progress Note Patient Name: Dale Munoz DOB: Nov 13, 1924 MRN: [id number]   Date of Service  12/29/2014  HPI/Events of Note  BL ASD, on bipap - CHF vs pneumonia   eICU Interventions  Wide complex on monitor with tachy - RBBB on EKG Change bipap settings to 15/10, RR 16 rechk ABG in 1hr     Intervention Category Major Interventions: Respiratory failure - evaluation and management  ALVA,RAKESH V. 12/29/2014, 9:34 PM

## 2014-12-29 NOTE — ED Notes (Signed)
Spoke with Delice Bison, Consulting civil engineer. Patient can be transported @ 1455.

## 2014-12-30 ENCOUNTER — Encounter (HOSPITAL_COMMUNITY): Payer: Self-pay | Admitting: Radiology

## 2014-12-30 ENCOUNTER — Inpatient Hospital Stay (HOSPITAL_COMMUNITY): Payer: Medicare Other

## 2014-12-30 ENCOUNTER — Telehealth: Payer: Self-pay | Admitting: Family Medicine

## 2014-12-30 DIAGNOSIS — Z8673 Personal history of transient ischemic attack (TIA), and cerebral infarction without residual deficits: Secondary | ICD-10-CM

## 2014-12-30 DIAGNOSIS — R778 Other specified abnormalities of plasma proteins: Secondary | ICD-10-CM | POA: Diagnosis present

## 2014-12-30 DIAGNOSIS — I10 Essential (primary) hypertension: Secondary | ICD-10-CM | POA: Diagnosis present

## 2014-12-30 DIAGNOSIS — I471 Supraventricular tachycardia: Secondary | ICD-10-CM | POA: Diagnosis not present

## 2014-12-30 DIAGNOSIS — R7989 Other specified abnormal findings of blood chemistry: Secondary | ICD-10-CM

## 2014-12-30 DIAGNOSIS — I5189 Other ill-defined heart diseases: Secondary | ICD-10-CM | POA: Diagnosis present

## 2014-12-30 DIAGNOSIS — I5023 Acute on chronic systolic (congestive) heart failure: Secondary | ICD-10-CM

## 2014-12-30 DIAGNOSIS — I4510 Unspecified right bundle-branch block: Secondary | ICD-10-CM | POA: Diagnosis present

## 2014-12-30 DIAGNOSIS — R0600 Dyspnea, unspecified: Secondary | ICD-10-CM

## 2014-12-30 DIAGNOSIS — I429 Cardiomyopathy, unspecified: Secondary | ICD-10-CM

## 2014-12-30 LAB — APTT: aPTT: 48 seconds — ABNORMAL HIGH (ref 24–37)

## 2014-12-30 LAB — BASIC METABOLIC PANEL
Anion gap: 10 (ref 5–15)
BUN: 29 mg/dL — AB (ref 6–20)
CO2: 29 mmol/L (ref 22–32)
CREATININE: 1.79 mg/dL — AB (ref 0.61–1.24)
Calcium: 8.2 mg/dL — ABNORMAL LOW (ref 8.9–10.3)
Chloride: 102 mmol/L (ref 101–111)
GFR calc Af Amer: 38 mL/min — ABNORMAL LOW (ref >60)
GFR, EST NON AFRICAN AMERICAN: 32 mL/min — AB (ref >60)
GLUCOSE: 121 mg/dL — AB (ref 65–99)
Potassium: 3.6 mmol/L (ref 3.5–5.1)
SODIUM: 141 mmol/L (ref 135–145)

## 2014-12-30 LAB — STREP PNEUMONIAE URINARY ANTIGEN: Strep Pneumo Urinary Antigen: NEGATIVE

## 2014-12-30 LAB — PROTIME-INR
INR: 1.25 (ref 0.00–1.49)
Prothrombin Time: 15.9 seconds — ABNORMAL HIGH (ref 11.6–15.2)

## 2014-12-30 LAB — MRSA PCR SCREENING: MRSA by PCR: NEGATIVE

## 2014-12-30 LAB — TROPONIN I: TROPONIN I: 2.98 ng/mL — AB (ref <0.031)

## 2014-12-30 MED ORDER — HEPARIN (PORCINE) IN NACL 100-0.45 UNIT/ML-% IJ SOLN
1000.0000 [IU]/h | INTRAMUSCULAR | Status: DC
  Administered 2014-12-30: 1000 [IU]/h via INTRAVENOUS
  Filled 2014-12-30 (×2): qty 250

## 2014-12-30 MED ORDER — METOPROLOL TARTRATE 25 MG PO TABS
25.0000 mg | ORAL_TABLET | Freq: Two times a day (BID) | ORAL | Status: DC
  Administered 2014-12-30: 25 mg via ORAL
  Filled 2014-12-30: qty 1

## 2014-12-30 MED ORDER — AMIODARONE HCL IN DEXTROSE 360-4.14 MG/200ML-% IV SOLN
30.0000 mg/h | INTRAVENOUS | Status: DC
  Administered 2014-12-31: 30 mg/h via INTRAVENOUS
  Filled 2014-12-30 (×2): qty 200

## 2014-12-30 MED ORDER — METOPROLOL TARTRATE 25 MG PO TABS: 25.0000 mg | ORAL_TABLET | Freq: Two times a day (BID) | ORAL | Status: DC

## 2014-12-30 MED ORDER — AMIODARONE HCL IN DEXTROSE 360-4.14 MG/200ML-% IV SOLN
60.0000 mg/h | INTRAVENOUS | Status: DC
  Administered 2014-12-30 (×2): 60 mg/h via INTRAVENOUS
  Filled 2014-12-30: qty 200

## 2014-12-30 MED ORDER — METOPROLOL TARTRATE 12.5 MG HALF TABLET
12.5000 mg | ORAL_TABLET | Freq: Two times a day (BID) | ORAL | Status: DC
  Administered 2014-12-30 – 2015-01-02 (×6): 12.5 mg via ORAL
  Filled 2014-12-30 (×6): qty 1

## 2014-12-30 MED ORDER — METOPROLOL TARTRATE 1 MG/ML IV SOLN
2.5000 mg | Freq: Once | INTRAVENOUS | Status: AC
  Administered 2014-12-30: 2.5 mg via INTRAVENOUS
  Filled 2014-12-30: qty 5

## 2014-12-30 MED ORDER — LISINOPRIL 10 MG PO TABS: 10.0000 mg | ORAL_TABLET | Freq: Every day | ORAL | Status: DC

## 2014-12-30 MED ORDER — FUROSEMIDE 10 MG/ML IJ SOLN: 40.0000 mg | Freq: Two times a day (BID) | INTRAMUSCULAR | Status: DC

## 2014-12-30 MED ORDER — ASPIRIN 325 MG PO TABS
325.0000 mg | ORAL_TABLET | Freq: Every day | ORAL | Status: DC
  Administered 2014-12-31 – 2015-01-01 (×2): 325 mg via ORAL
  Filled 2014-12-30 (×2): qty 1

## 2014-12-30 MED ORDER — DEXTROSE 5 % IV SOLN: 500.0000 mg | INTRAVENOUS | Status: DC

## 2014-12-30 MED ORDER — DOXYCYCLINE HYCLATE 100 MG IV SOLR
100.0000 mg | Freq: Two times a day (BID) | INTRAVENOUS | Status: DC
  Administered 2014-12-30 (×2): 100 mg via INTRAVENOUS
  Filled 2014-12-30 (×3): qty 100

## 2014-12-30 MED ORDER — AMIODARONE LOAD VIA INFUSION
150.0000 mg | Freq: Once | INTRAVENOUS | Status: AC
  Administered 2014-12-30: 150 mg via INTRAVENOUS
  Filled 2014-12-30: qty 83.34

## 2014-12-30 MED ORDER — DILTIAZEM HCL 100 MG IV SOLR: 5.0000 mg/h | INTRAVENOUS | Status: DC

## 2014-12-30 MED ORDER — FUROSEMIDE 10 MG/ML IJ SOLN
80.0000 mg | Freq: Two times a day (BID) | INTRAMUSCULAR | Status: DC
  Administered 2014-12-30 – 2014-12-31 (×2): 80 mg via INTRAVENOUS
  Filled 2014-12-30 (×2): qty 8

## 2014-12-30 MED ORDER — DILTIAZEM LOAD VIA INFUSION
20.0000 mg | Freq: Once | INTRAVENOUS | Status: DC
  Filled 2014-12-30: qty 20

## 2014-12-30 MED ORDER — LISINOPRIL 2.5 MG PO TABS
2.5000 mg | ORAL_TABLET | Freq: Every day | ORAL | Status: DC
  Administered 2014-12-30 – 2015-01-01 (×3): 2.5 mg via ORAL
  Filled 2014-12-30 (×4): qty 1

## 2014-12-30 MED ORDER — PERFLUTREN LIPID MICROSPHERE
1.0000 mL | INTRAVENOUS | Status: AC | PRN
  Administered 2014-12-30: 2 mL via INTRAVENOUS
  Filled 2014-12-30: qty 10

## 2014-12-30 MED ORDER — HEPARIN BOLUS VIA INFUSION
3000.0000 [IU] | Freq: Once | INTRAVENOUS | Status: AC
  Administered 2014-12-30: 3000 [IU] via INTRAVENOUS
  Filled 2014-12-30: qty 3000

## 2014-12-30 MED ORDER — PANTOPRAZOLE SODIUM 40 MG PO TBEC
40.0000 mg | DELAYED_RELEASE_TABLET | Freq: Every day | ORAL | Status: DC
  Administered 2014-12-30 – 2015-01-01 (×3): 40 mg via ORAL
  Filled 2014-12-30 (×3): qty 1

## 2014-12-30 MED ORDER — CEFTRIAXONE SODIUM 1 G IJ SOLR
1.0000 g | INTRAMUSCULAR | Status: DC
  Administered 2014-12-30: 1 g via INTRAVENOUS
  Filled 2014-12-30: qty 10

## 2014-12-30 MED ORDER — ATORVASTATIN CALCIUM 40 MG PO TABS
40.0000 mg | ORAL_TABLET | Freq: Every day | ORAL | Status: DC
  Administered 2014-12-30 – 2014-12-31 (×2): 40 mg via ORAL
  Filled 2014-12-30 (×2): qty 1

## 2014-12-30 MED ORDER — PERFLUTREN LIPID MICROSPHERE
INTRAVENOUS | Status: AC
  Filled 2014-12-30: qty 10

## 2014-12-30 NOTE — Progress Notes (Signed)
.  PT Cancellation Note  Patient Details Name: Dale Munoz MRN: [id number] DOB: 01-22-1925   Cancelled Treatment:    Reason Eval/Treat Not Completed: Patient not medically ready (patient on BiPAP. going for VQ scan. will check back later  today for medical stability.)   Rada Hay 12/30/2014, 7:58 AM Blanchard Kelch PT [phone]

## 2014-12-30 NOTE — Progress Notes (Signed)
ANTICOAGULATION CONSULT NOTE - Initial Consult  Pharmacy Consult for IV Heparin Indication: ACS/NSTEMI  Allergies  Allergen Reactions  . Penicillins Hives and Rash    Patient Measurements: Height:  (170.2 cm) Weight: 177 lb 11.1 oz (80.6 kg) IBW/kg (Calculated) : 66.1 Heparin Dosing Weight: 80.6kg  Vital Signs: Temp: 98.4 F (36.9 C) (10/03 1200) Temp Source: Axillary (10/03 1200) BP: 130/62 mmHg (10/03 1223) Pulse Rate: 84 (10/03 1223)  Labs:  Recent Labs  12/29/14 1211 12/29/14 1640 12/29/14 2302 12/30/14 0330 12/30/14 0705  HGB 12.7* 12.8*  --   --   --   HCT 37.6* 38.0*  --   --   --   PLT 206 215  --   --   --   CREATININE 1.67* 1.76*  --  1.79*  --   TROPONINI  --  0.72* 0.77*  --  2.98*    Estimated Creatinine Clearance: 29.6 mL/min (by C-G formula based on Cr of 1.79).   Medical History: Past Medical History  Diagnosis Date  . Asthma   . Chicken pox   . Stroke (HCC)   . Kidney disease   . Heart disease   . HTN (hypertension)   . Hyperlipemia   . Hypothyroidism   . Gallstones   . Hypertension   . Kidney stones   . Sleep apnea     Assessment: 59 yoM with PMHx CAD, CHF, and HTN presents with orthopnea, found to be hypoxic, hypokalemic and with mild ARF and mild elevated troponin.  CXR showed pulmonary edema and pt diagnosed with acute decompensated heart failure. BiPAP required overnight, and pt has remained febrile, tachycardic and with worsening leukocytosis and SCr.  Troponin bumped to 2.98.  MD would like to start IV heparin for presumed ACS/NSTEMI.    Pt has just received SQ heparin 5000 units @ 1440.  Also on ASA .  No bleeding issues noted per RN.  Plts WNL, Hgb a little low.  CrCl ~29 ml/min.    Goal of Therapy:  Heparin level 0.3-0.7 units/ml Monitor platelets by anticoagulation protocol: Yes   Plan:  D/C SQ heparin IV heparin 3000 unit bolus x 1 (lower bolus due to recent SQ heparin), then start heparin infusion 1000  units/hr = 10 ml/hr F/u 8 hour heparin level at 2300  Haynes Hoehn, PharmD, BCPS 12/30/2014, 2:53 PM  Pager: [phone]

## 2014-12-30 NOTE — Progress Notes (Signed)
PT Cancellation Note  Patient Details Name: Dale Munoz MRN: [id number] DOB: [birth year]   Cancelled Treatment:    Reason Eval/Treat Not Completed: Medical issues which prohibited therapy, requires BiPAP. Check on patient tomorrow.   Rada Hay 12/30/2014, 12:30 PM Blanchard Kelch PT [phone]

## 2014-12-30 NOTE — Telephone Encounter (Signed)
noted 

## 2014-12-30 NOTE — Progress Notes (Signed)
Attempted to wean patient from BIPAP to Non re breather 100%. Patient work of breathing increased. Oxygen saturation was between 88-91%. Respirations were increased during this period. Reapplied BIPAP and will continue to monitor.

## 2014-12-30 NOTE — Progress Notes (Signed)
Patient transported to CT on BiPAP. No complications were noted during transport or during procedure. Patient was returned back to room and is comfortable. RT will continue to monitor patient.

## 2014-12-30 NOTE — Telephone Encounter (Signed)
Pt in ICU at [facility]. He initially went for fall and shortness of breath. They determined that he has fluid around heart and lungs and are having trouble getting the fluid to go down.

## 2014-12-30 NOTE — Progress Notes (Addendum)
[facility] PROGRESS NOTE    Progress Note   CORDAE MCCAREY UJW:[id number] DOB: 19-Oct-1924 DOA: 01-26-15 PCP: Loreen Freud, DO   Brief Narrative:   Dale Munoz is an 81 y.o. male comes into the hospital with orthopnea  Assessment/Plan:     Acute respiratory failure with hypoxia (HCC) multifactorial  Acute systolic CHF (congestive heart failure), NYHA class 2 (HCC) and ? CAP: Require BiPAP overnight has remained febrile leukocytosis was continue to worsens, will start empirically on IV Rocephin and azithromycin. Extremity Doppler was negative for DVT, VQ scan could not be performed as he is on BiPAP, will try to wean him off the BiPAP,  cannot get a CT angiogram due to his renal function. Continue IV diuresis with IV Lasix, , patient continues to be negative. Repeat ABG is improved on BiPAP will continue current BiPAP settings. There is a mild bump in troponin but now has plateaued, the patient is chest pain-free. Likely demand ischemia. Patient does not desire to be intubated and does not want chest compressions. He would like to maintain current treatment. Place him nothing by mouth except for meds. Awaiting 2-D echo. Check a CT of the chest without contrast.  Elevated troponins/NSTEMI: In the setting of acute hypoxic respiratory failure, troponin's on 10.3.2016 bump to 3 start heparin, ASA and metoprolol. He has remained chest pain-free, @-d echo showed wall motion abnormality. Consult cardiology.  Hypothyroidism Continue Synthroid.  Hyperlipidemia: Continue statins.  Hypokalemia Repleted.    DVT Prophylaxis - Lovenox ordered.  Family Communication: none Disposition Plan: Home when stable. Code Status:     Code Status Orders        Start     Ordered   01-26-2015 1544  Full code   Continuous     26-Jan-2015 1543        IV Access:    Peripheral IV   Procedures and diagnostic studies:   Dg Chest Port 1 View  01/26/2015   CLINICAL DATA:  SOB  since last p.m. and bilateral crackles on exam. Pt was seen at [facility] for broken clavicle/shoulder last Friday and was given an arm sling. H/o asthma, stroke, HTN. Nonsmoker.  EXAM: PORTABLE CHEST - 1 VIEW  COMPARISON:  06/03/2009  FINDINGS: Airspace opacities in both lung bases left worse than right. Stable cardiomegaly. Tortuous thoracic aorta. Perihilar interstitial prominence may represent congestion or infiltrate. Septal lines suggested peripherally on the right. No definite effusion.  Degenerative changes in the right shoulder and bilateral AC joints.  IMPRESSION: 1. Bilateral interstitial edema or infiltrates with asymmetric basilar airspace disease, left worse than right.   Electronically Signed   By: Corlis Leak M.D.   On: 01/26/15 12:58     Medical Consultants:    None.  Anti-Infectives:   Anti-infectives    None      Subjective:    Dale Munoz he relates he had a lot of trouble breathing overnight. He denies any chest pain, palpitations sweating or epigastric pain. He relates cannot seem to catch his breath.  Objective:    Filed Vitals:   12/30/14 0300 12/30/14 0400 12/30/14 0500 12/30/14 0600  BP: 110/65 118/51 123/50 126/56  Pulse: 113 74 77 83  Temp:  99.6 F (37.6 C)    TempSrc:  Axillary    Resp: 32  Height:      Weight:   80.6 kg (177 lb 11.1 oz)   SpO2: 96% 97% 97% 97%    Intake/Output Summary (Last  24 hours) at 12/30/14 0722 Last data filed at 12/30/14 0600  Gross per 24 hour  Intake    120 ml  Output   1350 ml  Net  -1230 ml   Filed Weights   12/29/14 1518 12/29/14 2100 12/30/14 0500  Weight: 81.194 kg (179 lb) 81.2 kg (179 lb 0.2 oz) 80.6 kg (177 lb 11.1 oz)    Exam: Gen:  NAD Cardiovascular:  RRR, Respiratory:  Lungs good air movement but crackles on the left more than on the right. Gastrointestinal:  Abdomen soft, NT/ND, + BS Extremities:  No C/E/C   Data Reviewed:    Labs: Basic Metabolic Panel:  Recent Labs Lab  12/29/14 1211 12/29/14 1640 12/30/14 0330  NA 139  --  141  K 3.3*  --  3.6  CL 100*  --  102  CO2 27  --  29  GLUCOSE 119*  --  121*  BUN 27*  --  29*  CREATININE 1.67* 1.76* 1.79*  CALCIUM 8.8*  --  8.2*   GFR Estimated Creatinine Clearance: 29.6 mL/min (by C-G formula based on Cr of 1.79). Liver Function Tests: No results for input(s): AST, ALT, ALKPHOS, BILITOT, PROT, ALBUMIN in the last 168 hours. No results for input(s): LIPASE, AMYLASE in the last 168 hours. No results for input(s): AMMONIA in the last 168 hours. Coagulation profile No results for input(s): INR, PROTIME in the last 168 hours.  CBC:  Recent Labs Lab 12/29/14 1211 12/29/14 1640  WBC 13.2* 15.2*  HGB 12.7* 12.8*  HCT 37.6* 38.0*  MCV 96.9 96.9  PLT 206 215   Cardiac Enzymes:  Recent Labs Lab 12/29/14 1640 12/29/14 2302  TROPONINI 0.72* 0.77*   BNP (last 3 results) No results for input(s): PROBNP in the last 8760 hours. CBG: No results for input(s): GLUCAP in the last 168 hours. D-Dimer: No results for input(s): DDIMER in the last 72 hours. Hgb A1c: No results for input(s): HGBA1C in the last 72 hours. Lipid Profile: No results for input(s): CHOL, HDL, LDLCALC, TRIG, CHOLHDL, LDLDIRECT in the last 72 hours. Thyroid function studies: No results for input(s): TSH, T4TOTAL, T3FREE, THYROIDAB in the last 72 hours.  Invalid input(s): FREET3 Anemia work up: No results for input(s): VITAMINB12, FOLATE, FERRITIN, TIBC, IRON, RETICCTPCT in the last 72 hours. Sepsis Labs:  Recent Labs Lab 12/29/14 1211 12/29/14 1640  WBC 13.2* 15.2*   Microbiology Recent Results (from the past 240 hour(s))  MRSA PCR Screening     Status: None   Collection Time: 12/29/14 11:48 PM  Result Value Ref Range Status   MRSA by PCR NEGATIVE NEGATIVE Final    Comment:        The GeneXpert MRSA Assay (FDA approved for NASAL specimens only), is one component of a comprehensive MRSA colonization surveillance  program. It is not intended to diagnose MRSA infection nor to guide or monitor treatment for MRSA infections.      Medications:   . amiodarone  100 mg Oral Daily  . amLODipine  5 mg Oral Daily  . antiseptic oral rinse  7 mL Mouth Rinse q12n4p  . aspirin EC  81 mg Oral Daily  . chlorhexidine  15 mL Mouth Rinse BID  . furosemide  60 mg Intravenous 3 times per day  . heparin  5,000 Units Subcutaneous 3 times per day  . levothyroxine  50 mcg Oral QAC breakfast  . potassium chloride  40 mEq Oral Daily  . sodium chloride  3 mL Intravenous  Q12H   Continuous Infusions:   Time spent: 35 min   LOS: 1 day   Marinda Elk  [facility] Pager [phone]  *Please refer to [email or web address], password [email or web address] to get updated schedule on who will round on this patient, as hospitalists switch teams weekly. If 7PM-7AM, please contact night-coverage at [email or web address], password [email or web address] for any overnight needs.  12/30/2014, 7:22 AM

## 2014-12-30 NOTE — Progress Notes (Signed)
  Echocardiogram 2D Echocardiogram with Definity has been performed.  Idan, Prime 12/30/2014, 1:11 PM

## 2014-12-30 NOTE — Progress Notes (Signed)
PHARMACY NOTE -  ANTIBIOTIC RENAL DOSE ADJUSTMENT   Request received for Pharmacy to assist with antibiotic renal dose adjustment.  Patient has been initiated on ceftriaxone and doxycycline x 7 days for CAP. SCr 1.79, estimated CrCl 30 ml/min Current dosage is appropriate and need for further dosage adjustment appears unlikely at present. Will sign off at this time.  Please reconsult if a change in clinical status warrants re-evaluation of dosage.  Loralee Pacas, PharmD, BCPS Pager: [phone] 12/30/2014 8:08 AM

## 2014-12-30 NOTE — Consult Note (Signed)
Reason for Consult:   CHF, tachycardia  Requesting Physician: [facility] Primary Cardiologist [facility]  [facility]iology  HPI:   85 y/omale, lives alone, with an apparent history of arrhythmia- he has been on Amiodarone for some time. He denies any history of CAD or MI. He does say he had a remote stroke 10 yrs ago and was told he had carotid disease but he declined angiogram. He has been followed by Dr Jeralene Huff at [facility] cardiology. An echo from there in April 2015 showed an EF of 45%.          The pt is admitted now with acute CHF and respiratory failure. He is on Bi Pap but appears comfortable and is able to answer questions. Around 3 pm his HR went from 80 to 120- it appears to be an atrial tachycardia. His Troponin is now 2.98. His Echo shows an EF of 30-35% with anterior WMA. The pt denies any chest pain.   PMHx:  Past Medical History  Diagnosis Date  . Asthma   . Chicken pox   . Stroke (HCC)   . Kidney disease   . Heart disease   . HTN (hypertension)   . Hyperlipemia   . Hypothyroidism   . Gallstones   . Hypertension   . Kidney stones   . Sleep apnea     Past Surgical History  Procedure Laterality Date  . Tonsillectomy      SOCHx:  reports that he has never smoked. He has never used smokeless tobacco. He reports that he does not drink alcohol or use illicit drugs.  FAMHx: Family History  Problem Relation Age of Onset  . Colon cancer Neg Hx   . Colon polyps Neg Hx   . Kidney disease Neg Hx   . Diabetes Neg Hx   . Esophageal cancer Neg Hx   . Heart disease Neg Hx   . Gallbladder disease Neg Hx   . Sudden death Mother   . Sudden death Father     ALLERGIES: Allergies  Allergen Reactions  . Penicillins Hives and Rash    ROS: Review of Systems: General: negative for chills, fever, night sweats or weight changes.  Cardiovascular: negative for chest pain, dyspnea on exertion, edema, orthopnea, palpitations,  paroxysmal nocturnal dyspnea or shortness of breath HEENT: negative for any visual disturbances, blindness, glaucoma Dermatological: negative for rash Respiratory: negative for cough, hemoptysis, or wheezing Urologic: negative for hematuria or dysuria Abdominal: negative for nausea, vomiting, diarrhea, bright red blood per rectum, melena, or hematemesis Neurologic: negative for visual changes, syncope, or dizziness Musculoskeletal: negative for back pain, joint pain, or swelling Psych: cooperative and appropriate All other systems reviewed and are otherwise negative except as noted above.   HOME MEDICATIONS: Prior to Admission medications   Medication Sig Start Date End Date Taking? Authorizing Provider  amiodarone (PACERONE) 200 MG tablet Take 0.5 tablets (100 mg total) by mouth daily. Patient taking differently: Take 200 mg by mouth daily.  06/10/14  Yes Yvonne R Lowne, DO  amLODipine-benazepril (LOTREL) 5-20 MG capsule Take 1 capsule by mouth daily.   Yes Historical Provider, MD  aspirin EC 81 MG tablet Take 81 mg by mouth daily.   Yes Historical Provider, MD  Cholecalciferol (VITAMIN D) 2000 UNITS CAPS Take by mouth.   Yes Historical Provider, MD  levothyroxine (SYNTHROID, LEVOTHROID) 50 MCG tablet Take 1 tablet (50 mcg total) by mouth daily before breakfast. 09/20/14  Yes Lelon Perla, DO  naproxen sodium (ANAPROX) 220 MG tablet Take 220 mg by mouth 2 (two) times daily as needed (shoulder pain).   Yes Historical Provider, MD  torsemide (DEMADEX) 20 MG tablet Take 1 tablet (20 mg total) by mouth daily. Dose change. D/C PREVIOUS SCRIPTS FOR THIS MEDICATION 09/20/14  Yes Yvonne R Lowne, DO  zolpidem (AMBIEN CR) 6.25 MG CR tablet Take 1 tablet (6.25 mg total) by mouth at bedtime as needed for sleep. 09/20/14  Yes Yvonne R Lowne, DO  amLODipine (NORVASC) 10 MG tablet Take 1 tablet (10 mg total) by mouth daily. 07/25/14   Lelon Perla, DO    HOSPITAL MEDICATIONS: I have reviewed the  patient's current medications.  VITALS: Blood pressure 130/62, pulse 84, temperature 98.4 F (36.9 C), temperature source Axillary, resp. rate 25, height  (1.702 m), weight 177 lb 11.1 oz (80.6 kg), SpO2 94 %.  PHYSICAL EXAM: General appearance: alert, cooperative, no distress and on Bi pap Neck: no carotid bruit and no JVD Lungs: decreased breath sounds, scattered rales, wheezing Heart: regular rate and rhythm and increased rate Abdomen: soft, non-tender; bowel sounds normal; no masses,  no organomegaly Extremities: extremities normal, atraumatic, no cyanosis or edema Pulses: diminnished, no FA bruits Skin: cool, pale, and dry Neurologic: Grossly normal  LABS: Results for orders placed or performed during the hospital encounter of 12/29/14 (from the past 24 hour(s))  CBC     Status: Abnormal   Collection Time: 12/29/14  4:40 PM  Result Value Ref Range   WBC 15.2 (H) 4.0 - 10.5 K/uL   RBC 3.92 (L) 4.22 - 5.81 MIL/uL   Hemoglobin 12.8 (L) 13.0 - 17.0 g/dL   HCT 16.1 (L) 09.6 - 04.5 %   MCV 96.9 78.0 - 100.0 fL   MCH 32.7 26.0 - 34.0 pg   MCHC 33.7 30.0 - 36.0 g/dL   RDW 40.9 81.1 - 91.4 %   Platelets 215 150 - 400 K/uL  Creatinine, serum     Status: Abnormal   Collection Time: 12/29/14  4:40 PM  Result Value Ref Range   Creatinine, Ser 1.76 (H) 0.61 - 1.24 mg/dL   GFR calc non Af Amer 33 (L) >60 mL/min   GFR calc Af Amer 38 (L) >60 mL/min  Troponin I     Status: Abnormal   Collection Time: 12/29/14  4:40 PM  Result Value Ref Range   Troponin I 0.72 (HH) <0.031 ng/mL  Blood gas, arterial     Status: Abnormal   Collection Time: 12/29/14 10:14 PM  Result Value Ref Range   FIO2 1.00    Delivery systems BILEVEL POSITIVE AIRWAY PRESSURE    Mode BILEVEL POSITIVE AIRWAY PRESSURE    Inspiratory PAP 15    Expiratory PAP 10    pH, Arterial 7.471 (H) 7.350 - 7.450   pCO2 arterial 36.3 35.0 - 45.0 mmHg   pO2, Arterial 84.0 80.0 - 100.0 mmHg   Bicarbonate 26.3 (H) 20.0 -  24.0 mEq/L   TCO2 23.2 0 - 100 mmol/L   Acid-Base Excess 3.0 (H) 0.0 - 2.0 mmol/L   O2 Saturation 96.5 %   Patient temperature 97.7    Collection site RIGHT RADIAL    Drawn by [id number]    Sample type ARTERIAL DRAW    Allens test (pass/fail) PASS PASS  Troponin I     Status: Abnormal   Collection Time: 12/29/14 11:02 PM  Result Value Ref Range   Troponin I 0.77 (  HH) <0.031 ng/mL  MRSA PCR Screening     Status: None   Collection Time: 12/29/14 11:48 PM  Result Value Ref Range   MRSA by PCR NEGATIVE NEGATIVE  Basic metabolic panel     Status: Abnormal   Collection Time: 12/30/14  3:30 AM  Result Value Ref Range   Sodium 141 135 - 145 mmol/L   Potassium 3.6 3.5 - 5.1 mmol/L   Chloride 102 101 - 111 mmol/L   CO2 29 22 - 32 mmol/L   Glucose, Bld 121 (H) 65 - 99 mg/dL   BUN 29 (H) 6 - 20 mg/dL   Creatinine, Ser 4.09 (H) 0.61 - 1.24 mg/dL   Calcium 8.2 (L) 8.9 - 10.3 mg/dL   GFR calc non Af Amer 32 (L) >60 mL/min   GFR calc Af Amer 38 (L) >60 mL/min   Anion gap 10 5 - 15  Troponin I     Status: Abnormal   Collection Time: 12/30/14  7:05 AM  Result Value Ref Range   Troponin I 2.98 (HH) <0.031 ng/mL  Strep pneumoniae urinary antigen     Status: None   Collection Time: 12/30/14  8:45 AM  Result Value Ref Range   Strep Pneumo Urinary Antigen NEGATIVE NEGATIVE    EKG: Atrial tachycardia with RBBB  Echo: 12/30/14 Study Conclusions  - Left ventricle: The cavity size was moderately dilated. Systolic function was moderately to severely reduced. The estimated ejection fraction was in the range of 30% to 35%. Akinesis of the apicalinferolateral and apical myocardium. Features are consistent with a pseudonormal left ventricular filling pattern, with concomitant abnormal relaxation and increased filling pressure (grade 2 diastolic dysfunction). - Left atrium: The atrium was mildly dilated. - Pulmonary arteries: Systolic pressure was moderately increased. PA peak  pressure: 55 mm Hg (S).   IMAGING: Ct Chest Wo Contrast 01/19/15  IMPRESSION: Moderate BILATERAL pleural effusions larger on LEFT.  BILATERAL airspace infiltrates greatest in RIGHT upper lobe, question asymmetric edema versus pneumonia.  Cholelithiasis.  Extensive atherosclerotic disease including coronary arteries.   Electronically Signed   By: Ulyses Southward M.D.   On: 12/30/2014 13:19   Dg Chest Port 1 View 12/29/2014   .  IMPRESSION: 1. Bilateral interstitial edema or infiltrates with asymmetric basilar airspace disease, left worse than right.   Electronically Signed   By: Corlis Leak M.D.   On: 12/29/2014 12:58    IMPRESSION: Principal Problem:   Acute respiratory failure with hypoxia (HCC) Active Problems:   Acute combined systolic and diastolic heart failure (HCC)   Troponin level elevated   Atrial tachycardia (HCC)   Cardiomyopathy- etiology not yet determined   Essential hypertension   Diastolic dysfunction-grade 2   Carotid stenosis- pt declined angiogram   Hypothyroidism   Hyperlipidemia   Hypokalemia   RBBB   History of stroke-remote   RECOMMENDATION: Will review with MD. New drop in LVF and Troponin elevation in setting of CHF and atrial arrhythmia. He seems to be tolerating this well. Heparin ordered. Consider Diltiazem for rate control. I have requested records from Dr Garner Nash be sent to the ICU here.   Time Spent Directly with Patient: 45 minutes  Abelino Derrick [phone] beeper 12/30/2014, 3:08 PM  As above; patient seen and examined; 81 year old male with past medical history of supraventricular tachycardia, sick sinus syndrome, coronary artery disease, chronic systolic congestive heart failure for evaluation of acute on chronic systolic heart failure and non-ST elevation myocardial infarction. The patient is followed in [facility].  He has been treated with low-dose amiodarone for SVT. There is note of previous sick sinus syndrome with pauses and pacemaker was  recommended but the patient declined. Patient was admitted with 3 days of increasing dyspnea on exertion, orthopnea and pedal edema. No chest pain or palpitations. He has been treated with diuretics with some improvement. His troponin has increased to 2.98 and cardiology asked to evaluate. Patient has also noted to have intermittent SVT at rates of 120 to 130. Electrocardiogram shows supraventricular tachycardia, right bundle branch block, inferior lateral infarct. BUN and creatinine 29 and 1.79. Echocardiogram shows ejection fraction 30-35%. Patient likely has had a non-ST elevation myocardial infarction related to demand ischemia from his supraventricular tachycardia. Agree with aspirin, statin and 48 hours of heparin. He does not want any procedures and is clear about this. He states I have lived a good life and does not want heroic measures. He is a no CODE BLUE. He does have significant acute on chronic systolic congestive heart failure. Would treat with Lasix 80 mg IV twice a day and follow renal function. Resume lisinopril 2.5 mg daily and continue if renal function tolerates. He is having intermittent SVT with rapid ventricular response. Would treat with IV amiodarone (patient on 100 mg po daily at home). Decrease metoprolol to 12.5 mg twice a day. Follow closely for bradycardia as he apparently has had some degree of pauses previously and he declined pacemaker. Hopefully we can reestablish sinus rhythm and then resume amiodarone at previous dose once his congestive heart failure improves. Olga Millers

## 2014-12-31 ENCOUNTER — Ambulatory Visit: Payer: Medicare Other | Admitting: Family Medicine

## 2014-12-31 DIAGNOSIS — I5041 Acute combined systolic (congestive) and diastolic (congestive) heart failure: Principal | ICD-10-CM

## 2014-12-31 DIAGNOSIS — J9601 Acute respiratory failure with hypoxia: Secondary | ICD-10-CM

## 2014-12-31 LAB — BASIC METABOLIC PANEL
ANION GAP: 9 (ref 5–15)
BUN: 32 mg/dL — ABNORMAL HIGH (ref 6–20)
CALCIUM: 8.2 mg/dL — AB (ref 8.9–10.3)
CO2: 29 mmol/L (ref 22–32)
Chloride: 103 mmol/L (ref 101–111)
Creatinine, Ser: 1.88 mg/dL — ABNORMAL HIGH (ref 0.61–1.24)
GFR, EST AFRICAN AMERICAN: 35 mL/min — AB (ref >60)
GFR, EST NON AFRICAN AMERICAN: 31 mL/min — AB (ref >60)
GLUCOSE: 121 mg/dL — AB (ref 65–99)
POTASSIUM: 3.2 mmol/L — AB (ref 3.5–5.1)
Sodium: 141 mmol/L (ref 135–145)

## 2014-12-31 LAB — HEPARIN LEVEL (UNFRACTIONATED)
HEPARIN UNFRACTIONATED: 0.36 [IU]/mL (ref 0.30–0.70)
Heparin Unfractionated: 0.33 IU/mL (ref 0.30–0.70)

## 2014-12-31 LAB — LEGIONELLA PNEUMOPHILA SEROGP 1 UR AG: L. pneumophila Serogp 1 Ur Ag: NEGATIVE

## 2014-12-31 LAB — HIV ANTIBODY (ROUTINE TESTING W REFLEX): HIV Screen 4th Generation wRfx: NONREACTIVE

## 2014-12-31 MED ORDER — FUROSEMIDE 40 MG PO TABS
80.0000 mg | ORAL_TABLET | Freq: Two times a day (BID) | ORAL | Status: DC
  Administered 2014-12-31: 80 mg via ORAL
  Filled 2014-12-31: qty 2

## 2014-12-31 MED ORDER — MORPHINE SULFATE (PF) 2 MG/ML IV SOLN
2.0000 mg | INTRAVENOUS | Status: DC | PRN
  Administered 2014-12-31: 2 mg via INTRAVENOUS
  Filled 2014-12-31: qty 1

## 2014-12-31 MED ORDER — AMIODARONE HCL 200 MG PO TABS
200.0000 mg | ORAL_TABLET | Freq: Two times a day (BID) | ORAL | Status: DC
  Administered 2014-12-31 – 2015-01-02 (×5): 200 mg via ORAL
  Filled 2014-12-31 (×5): qty 1

## 2014-12-31 MED ORDER — POTASSIUM CHLORIDE CRYS ER 20 MEQ PO TBCR
40.0000 meq | EXTENDED_RELEASE_TABLET | Freq: Two times a day (BID) | ORAL | Status: AC
  Administered 2014-12-31 (×2): 40 meq via ORAL
  Filled 2014-12-31 (×2): qty 2

## 2014-12-31 MED ORDER — HEPARIN (PORCINE) IN NACL 100-0.45 UNIT/ML-% IJ SOLN
1050.0000 [IU]/h | INTRAMUSCULAR | Status: DC
  Filled 2014-12-31: qty 250

## 2014-12-31 MED ORDER — FUROSEMIDE 10 MG/ML IJ SOLN
80.0000 mg | Freq: Once | INTRAMUSCULAR | Status: AC
  Administered 2014-12-31: 80 mg via INTRAVENOUS
  Filled 2014-12-31: qty 8

## 2014-12-31 MED ORDER — FUROSEMIDE 10 MG/ML IJ SOLN
40.0000 mg | Freq: Once | INTRAMUSCULAR | Status: AC
  Administered 2014-12-31: 40 mg via INTRAVENOUS
  Filled 2014-12-31: qty 4

## 2014-12-31 NOTE — Progress Notes (Signed)
PT Cancellation Note  Patient Details Name: JAVELLE DONIGAN MRN: [id number] DOB: [birth year]   Cancelled Treatment:    Reason Eval/Treat Not Completed: Medical issues which prohibited therapy (per RN, patient just taken off of BiPAP and to not stress patient.. will check bacl  at a later time for medical stability for mobility.)   Rada Hay 12/31/2014, 8:32 AM Blanchard Kelch PT [phone]

## 2014-12-31 NOTE — Progress Notes (Signed)
[id number] with patient/refused hospice or any hhc assistance/does not want to go home with o2 he will take his medication as prescribed but has three other men living in the house where he stays that are will to help with the meals and any care ./Rhonda Davis,RN,BSN,CCM

## 2014-12-31 NOTE — Progress Notes (Signed)
Pt requesting to come off BIPAP at this time.  Pt placed on NRB and is tolerating well at this time, RT to monitor and assess as needed.

## 2014-12-31 NOTE — Progress Notes (Signed)
[id number] Tigerlily Christine,RN,BSN,CCM: spoke with patient and brother [name] concerning placement for patient to go post hospitalization.  Brother and wife would like for patient to go to [facility] to get stronger and continue care if able.  Patient is verbally agreeable with this.  Dr. Robb Matar notified.  Bryan with [facility] is performing evaluation for possible transfer.

## 2014-12-31 NOTE — Progress Notes (Signed)
Patient ID: Dale Munoz, male   DOB: 26-Mar-1925, 81 y.o.   MRN: [id number]    Subjective:  Denies SSCP, palpitations or Dyspnea Wants to go home   Objective:  Filed Vitals:   12/31/14 0200 12/31/14 0400 12/31/14 0600 12/31/14 0737  BP: 120/42 111/40 112/43 112/43  Pulse: 66 62 64 71  Temp:  97.7 F (36.5 C)    TempSrc:  Axillary    Resp: Height:      Weight:  80.5 kg (177 lb 7.5 oz)    SpO2: 95% 95% 93% 100%    Intake/Output from previous day:  Intake/Output Summary (Last 24 hours) at 12/31/14 0742 Last data filed at 12/31/14 0700  Gross per 24 hour  Intake  906.6 ml  Output   1025 ml  Net -118.4 ml    Physical Exam: Affect appropriate Frail elderly male  HEENT: normal Neck supple with no adenopathy JVP normal no bruits no thyromegaly Lungs inspitory crackles bilaterally no wheezing and good diaphragmatic motion Heart:  S1/S2 no murmur, no rub, gallop or click PMI normal Abdomen: benighn, BS positve, no tenderness, no AAA no bruit.  No HSM or HJR Distal pulses intact with no bruits No edema Neuro non-focal Skin warm and dry No muscular weakness   Lab Results: Basic Metabolic Panel:  Recent Labs  18/84/16 0330 12/31/14 0409  NA 141 141  K 3.6 3.2*  CL 102 103  CO2 29 29  GLUCOSE 121* 121*  BUN 29* 32*  CREATININE 1.79* 1.88*  CALCIUM 8.2* 8.2*   CBC:  Recent Labs  12/29/14 1211 12/29/14 1640  WBC 13.2* 15.2*  HGB 12.7* 12.8*  HCT 37.6* 38.0*  MCV 96.9 96.9  PLT 206 215   Cardiac Enzymes:  Recent Labs  12/29/14 1640 12/29/14 2302 12/30/14 0705  TROPONINI 0.72* 0.77* 2.98*    Imaging: Ct Chest Wo Contrast  12/30/2014   CLINICAL DATA:  Acute respiratory failure with hypoxia, acute renal failure, acute systolic CHF, pulmonary edema, asthma, hypertension, heart disease  EXAM: CT CHEST WITHOUT CONTRAST  TECHNIQUE: Multidetector CT imaging of the chest was performed following the standard protocol without IV contrast.  Sagittal and coronal MPR images reconstructed from axial data set. IV contrast not administered due to renal dysfunction.  COMPARISON:  Chest radiograph 12/29/2014  FINDINGS: Atherosclerotic calcifications aorta, coronary arteries, and proximal great vessels.  Aorta normal caliber.  Upper normal size RIGHT paratracheal node 10 mm short axis image 22.  No thoracic adenopathy.  Multiple gallstones in gallbladder, largest up to 18 mm diameter.  Remaining visualized portion of upper abdomen unremarkable.  BILATERAL pleural effusions, moderate size, slightly larger on LEFT.  Compressive atelectasis of LEFT lower lobe.  Patchy airspace infiltrates identified bilaterally throughout all lobes greatest in RIGHT upper lobe.  No discrete pulmonary mass or pneumothorax.  Diffuse osseous demineralization with scattered degenerative disc disease changes thoracic spine.  IMPRESSION: Moderate BILATERAL pleural effusions larger on LEFT.  BILATERAL airspace infiltrates greatest in RIGHT upper lobe, question asymmetric edema versus pneumonia.  Cholelithiasis.  Extensive atherosclerotic disease including coronary arteries.   Electronically Signed   By: Ulyses Southward M.D.   On: 12/30/2014 13:19   Dg Chest Port 1 View  12/29/2014   CLINICAL DATA:  SOB since last p.m. and bilateral crackles on exam. Pt was seen at [facility] for broken clavicle/shoulder last Friday and was given an arm sling. H/o asthma, stroke, HTN. Nonsmoker.  EXAM: PORTABLE CHEST - 1 VIEW  COMPARISON:  06/03/2009  FINDINGS: Airspace opacities in both lung bases left worse than right. Stable cardiomegaly. Tortuous thoracic aorta. Perihilar interstitial prominence may represent congestion or infiltrate. Septal lines suggested peripherally on the right. No definite effusion.  Degenerative changes in the right shoulder and bilateral AC joints.  IMPRESSION: 1. Bilateral interstitial edema or infiltrates with asymmetric basilar airspace disease, left worse than right.    Electronically Signed   By: Corlis Leak M.D.   On: 12/29/2014 12:58    Cardiac Studies:  ECG:  Orders placed or performed during the hospital encounter of 12/29/14  . EKG 12-Lead  . EKG 12-Lead  . 12 lead EKG  . 12 lead EKG  . EKG     Telemetry:  NSR RBBB  12/31/2014   Echo:  10/3  EF 30-35%    Medications:   . antiseptic oral rinse  7 mL Mouth Rinse q12n4p  . aspirin  325 mg Oral Daily  . atorvastatin  40 mg Oral q1800  . cefTRIAXone (ROCEPHIN)  IV  1 g Intravenous Q24H  . chlorhexidine  15 mL Mouth Rinse BID  . doxycycline (VIBRAMYCIN) IV  100 mg Intravenous Q12H  . furosemide  80 mg Intravenous Q12H  . levothyroxine  50 mcg Oral QAC breakfast  . lisinopril  2.5 mg Oral Daily  . metoprolol tartrate  12.5 mg Oral BID  . pantoprazole  40 mg Oral Daily  . potassium chloride  40 mEq Oral BID  . sodium chloride  3 mL Intravenous Q12H     . amiodarone 30 mg/hr (12/31/14 0026)  . heparin      Assessment/Plan:  Arrhythmia:  SSS with SVT  Continue lower dose metoprolol and iv amiodarone can transition to PO amiodarone 200 bid CHF:  Still sounds wet  Continue iv lasix transition to PO when Cr >2 or 24 hrs  ACE started by Dr Jens Som ID:  On rocephin Troponin:  No chest pain ECG non acute patient DNR and does not wish pacer, cath or AICD  Consider palliative consult.     Charlton Haws 12/31/2014, 7:42 AM

## 2014-12-31 NOTE — Progress Notes (Signed)
[facility] PROGRESS NOTE    Progress Note   CINQUE BEGLEY ZOX:[id number] DOB: 07/21/1924 DOA: 12/29/2014 PCP: Loreen Freud, DO   Brief Narrative:   Dale Munoz is an 81 y.o. male comes into the hospital with orthopnea and diagnosed with a non-ST elevation MI, started on IV heparin, aspirin, statin and beta blocker cardiology was consulted the patient did not want aggressive treatment. A 2-D echo was done that showed wall motion abnormalities.  Assessment/Plan:     Acute respiratory failure with hypoxia (HCC) multifactorial  Acute systolic CHF (congestive heart failure), NYHA class 2 (HCC) in the setting of non-ST elevation MI: - Cardiology was consulted who recommended to Continue IV diuresis with IV Lasix, , patient continues to be negative. Repeat ABG is improved on BiPAP will continue current BiPAP settings. - 2-D echo showed normal flow measure in abnormality - Decrease beta blocker and continue amiodarone. - I had a long discussion with patient and he doesn't want any aggressive treatment for escalation of treatment. He would like to go home and be at home and if he is to pass away he would like to pass away at home. He would like to stop all the IV medications and switch to oral medications in order to try to get home today. - He is a DNR/DNI and will move towards comfort route. - Changes Lasix to 80 mg twice a day, continue low-dose beta blocker, amiodarone 200 mg by mouth twice a day and continue lisinopril. - Consult case manager to help with transition to home.  Hypothyroidism: Continue Synthroid.  Hyperlipidemia: Continue statins.  Hypokalemia: Repleted.    DVT Prophylaxis - Lovenox ordered.  Family Communication: none Disposition Plan: Home when stable. Code Status:     Code Status Orders        Start     Ordered   12/29/14 1544  Full code   Continuous     12/29/14 1543        IV Access:    Peripheral IV   Procedures and diagnostic  studies:   Ct Chest Wo Contrast  01-03-15   CLINICAL DATA:  Acute respiratory failure with hypoxia, acute renal failure, acute systolic CHF, pulmonary edema, asthma, hypertension, heart disease  EXAM: CT CHEST WITHOUT CONTRAST  TECHNIQUE: Multidetector CT imaging of the chest was performed following the standard protocol without IV contrast. Sagittal and coronal MPR images reconstructed from axial data set. IV contrast not administered due to renal dysfunction.  COMPARISON:  Chest radiograph 12/29/2014  FINDINGS: Atherosclerotic calcifications aorta, coronary arteries, and proximal great vessels.  Aorta normal caliber.  Upper normal size RIGHT paratracheal node 10 mm short axis image 22.  No thoracic adenopathy.  Multiple gallstones in gallbladder, largest up to 18 mm diameter.  Remaining visualized portion of upper abdomen unremarkable.  BILATERAL pleural effusions, moderate size, slightly larger on LEFT.  Compressive atelectasis of LEFT lower lobe.  Patchy airspace infiltrates identified bilaterally throughout all lobes greatest in RIGHT upper lobe.  No discrete pulmonary mass or pneumothorax.  Diffuse osseous demineralization with scattered degenerative disc disease changes thoracic spine.  IMPRESSION: Moderate BILATERAL pleural effusions larger on LEFT.  BILATERAL airspace infiltrates greatest in RIGHT upper lobe, question asymmetric edema versus pneumonia.  Cholelithiasis.  Extensive atherosclerotic disease including coronary arteries.   Electronically Signed   By: Dale Munoz M.D.   On: Jan 03, 2015 13:19   Dg Chest Port 1 View  12/29/2014   CLINICAL DATA:  SOB since last p.m. and bilateral  crackles on exam. Pt was seen at [facility] for broken clavicle/shoulder last Friday and was given an arm sling. H/o asthma, stroke, HTN. Nonsmoker.  EXAM: PORTABLE CHEST - 1 VIEW  COMPARISON:  06/03/2009  FINDINGS: Airspace opacities in both lung bases left worse than right. Stable cardiomegaly. Tortuous thoracic aorta.  Perihilar interstitial prominence may represent congestion or infiltrate. Septal lines suggested peripherally on the right. No definite effusion.  Degenerative changes in the right shoulder and bilateral AC joints.  IMPRESSION: 1. Bilateral interstitial edema or infiltrates with asymmetric basilar airspace disease, left worse than right.   Electronically Signed   By: Corlis Leak M.D.   On: 12/29/2014 12:58     Medical Consultants:    None.  Anti-Infectives:   Anti-infectives    Start     Dose/Rate Route Frequency Ordered Stop   12/30/14 0900  doxycycline (VIBRAMYCIN) 100 mg in dextrose 5 % 250 mL IVPB     100 mg 125 mL/hr over 120 Minutes Intravenous Every 12 hours 12/30/14 0757     12/30/14 0830  azithromycin (ZITHROMAX) 500 mg in dextrose 5 % 250 mL IVPB  Status:  Discontinued     500 mg 250 mL/hr over 60 Minutes Intravenous Every 24 hours 12/30/14 0724 12/30/14 0757   12/30/14 0800  cefTRIAXone (ROCEPHIN) 1 g in dextrose 5 % 50 mL IVPB     1 g 100 mL/hr over 30 Minutes Intravenous Every 24 hours 12/30/14 0724 01/06/15 0759      Subjective:    Dale Munoz he feels like his respiration has improved. But he relates he does not want any further aggressive treatment. He would like to be taken off the IV medication and portable oral regimen so he could get home. He relates he would like to spend his last few days at home.  Objective:    Filed Vitals:   12/31/14 0027 12/31/14 0200 12/31/14 0400 12/31/14 0600  BP:  120/42 111/40 112/43  Pulse:  66 62 64  Temp:   97.7 F (36.5 C)   TempSrc:   Axillary   Resp: 32 Height:      Weight:   80.5 kg (177 lb 7.5 oz)   SpO2:  95% 95% 93%    Intake/Output Summary (Last 24 hours) at 12/31/14 0737 Last data filed at 12/31/14 0700  Gross per 24 hour  Intake  906.6 ml  Output   1025 ml  Net -118.4 ml   Filed Weights   12/29/14 2100 12/30/14 0500 12/31/14 0400  Weight: 81.2 kg (179 lb 0.2 oz) 80.6 kg (177 lb 11.1 oz)  80.5 kg (177 lb 7.5 oz)    Exam: Gen:  NAD Cardiovascular:  RRR,+JVD Respiratory:  Good air movement with crackles in lungs bilaterally. Gastrointestinal:  Abdomen soft, NT/ND, + BS Extremities:  No C/E/C   Data Reviewed:    Labs: Basic Metabolic Panel:  Recent Labs Lab 12/29/14 1211 12/29/14 1640 12/30/14 0330 12/31/14 0409  NA 139  --  141 141  K 3.3*  --  3.6 3.2*  CL 100*  --  102 103  CO2 27  --  29 29  GLUCOSE 119*  --  121* 121*  BUN 27*  --  29* 32*  CREATININE 1.67* 1.76* 1.79* 1.88*  CALCIUM 8.8*  --  8.2* 8.2*   GFR Estimated Creatinine Clearance: 28.2 mL/min (by C-G formula based on Cr of 1.88). Liver Function Tests: No results for input(s): AST, ALT, ALKPHOS, BILITOT,  PROT, ALBUMIN in the last 168 hours. No results for input(s): LIPASE, AMYLASE in the last 168 hours. No results for input(s): AMMONIA in the last 168 hours. Coagulation profile  Recent Labs Lab 12/30/14 1513  INR 1.25    CBC:  Recent Labs Lab 12/29/14 1211 12/29/14 1640  WBC 13.2* 15.2*  HGB 12.7* 12.8*  HCT 37.6* 38.0*  MCV 96.9 96.9  PLT 206 215   Cardiac Enzymes:  Recent Labs Lab 12/29/14 1640 12/29/14 2302 12/30/14 0705  TROPONINI 0.72* 0.77* 2.98*   BNP (last 3 results) No results for input(s): PROBNP in the last 8760 hours. CBG: No results for input(s): GLUCAP in the last 168 hours. D-Dimer: No results for input(s): DDIMER in the last 72 hours. Hgb A1c: No results for input(s): HGBA1C in the last 72 hours. Lipid Profile: No results for input(s): CHOL, HDL, LDLCALC, TRIG, CHOLHDL, LDLDIRECT in the last 72 hours. Thyroid function studies: No results for input(s): TSH, T4TOTAL, T3FREE, THYROIDAB in the last 72 hours.  Invalid input(s): FREET3 Anemia work up: No results for input(s): VITAMINB12, FOLATE, FERRITIN, TIBC, IRON, RETICCTPCT in the last 72 hours. Sepsis Labs:  Recent Labs Lab 12/29/14 1211 12/29/14 1640  WBC 13.2* 15.2*    Microbiology Recent Results (from the past 240 hour(s))  MRSA PCR Screening     Status: None   Collection Time: 12/29/14 11:48 PM  Result Value Ref Range Status   MRSA by PCR NEGATIVE NEGATIVE Final    Comment:        The GeneXpert MRSA Assay (FDA approved for NASAL specimens only), is one component of a comprehensive MRSA colonization surveillance program. It is not intended to diagnose MRSA infection nor to guide or monitor treatment for MRSA infections.      Medications:   . antiseptic oral rinse  7 mL Mouth Rinse q12n4p  . aspirin  325 mg Oral Daily  . atorvastatin  40 mg Oral q1800  . cefTRIAXone (ROCEPHIN)  IV  1 g Intravenous Q24H  . chlorhexidine  15 mL Mouth Rinse BID  . doxycycline (VIBRAMYCIN) IV  100 mg Intravenous Q12H  . furosemide  80 mg Intravenous Q12H  . levothyroxine  50 mcg Oral QAC breakfast  . lisinopril  2.5 mg Oral Daily  . metoprolol tartrate  12.5 mg Oral BID  . pantoprazole  40 mg Oral Daily  . sodium chloride  3 mL Intravenous Q12H   Continuous Infusions: . amiodarone 30 mg/hr (12/31/14 0026)  . heparin      Time spent: 35 min   LOS: 2 days   Marinda Elk  [facility] Pager [phone]  *Please refer to [email or web address], password [email or web address] to get updated schedule on who will round on this patient, as hospitalists switch teams weekly. If 7PM-7AM, please contact night-coverage at [email or web address], password [email or web address] for any overnight needs.  12/31/2014, 7:37 AM

## 2014-12-31 NOTE — Progress Notes (Signed)
RT called to bedside to place patient back on BiPAP. Patient O2 saturations were 83% on NRB and patient WOB increased. BiPAP was placed back on patient with previous settings of 15/+10. FiO2 was titrated to 60% to maintain a FiO2 of 90% or greater. Patient states he is more comfortable and WOB has decreased. RN at bedside. RT will continue to monitor patient.

## 2014-12-31 NOTE — Progress Notes (Signed)
Dale Munoz states he does not want to use o2 at home .States he is at peace with the situation.

## 2014-12-31 NOTE — Progress Notes (Signed)
ANTICOAGULATION CONSULT NOTE - Initial Consult  Pharmacy Consult for IV Heparin Indication: ACS/NSTEMI  Allergies  Allergen Reactions  . Penicillins Hives and Rash    Patient Measurements: Height:  (170.2 cm) Weight: 177 lb 7.5 oz (80.5 kg) IBW/kg (Calculated) : 66.1 Heparin Dosing Weight: 80.6kg  Vital Signs: Temp: 97.7 F (36.5 C) (10/04 0400) Temp Source: Axillary (10/04 0400) BP: 112/43 mmHg (10/04 0600) Pulse Rate: 64 (10/04 0600)  Labs:  Recent Labs  12/29/14 1211 12/29/14 1640 12/29/14 2302 12/30/14 0330 12/30/14 0705 12/30/14 1513 12/30/14 2356 12/31/14 0409 12/31/14 0605  HGB 12.7* 12.8*  --   --   --   --   --   --   --   HCT 37.6* 38.0*  --   --   --   --   --   --   --   PLT 206 215  --   --   --   --   --   --   --   APTT  --   --   --   --   --  48*  --   --   --   LABPROT  --   --   --   --   --  15.9*  --   --   --   INR  --   --   --   --   --  1.25  --   --   --   HEPARINUNFRC  --   --   --   --   --   --  0.36  --  0.33  CREATININE 1.67* 1.76*  --  1.79*  --   --   --  1.88*  --   TROPONINI  --  0.72* 0.77*  --  2.98*  --   --   --   --     Estimated Creatinine Clearance: 28.2 mL/min (by C-G formula based on Cr of 1.88).  Assessment: 28 yoM with PMHx CAD, CHF, and HTN presents with orthopnea, found to be hypoxic, hypokalemic and with mild ARF and mild elevated troponin.  CXR showed pulmonary edema and pt diagnosed with acute decompensated heart failure. BiPAP required overnight, and pt has remained febrile, tachycardic and with worsening leukocytosis and SCr.  Troponin bumped to 2.98.  Pharmacy consulted to dose IV heparin for presumed ACS/NSTEMI.  Per Cardiology, planning 48hrs heparin therapy.  Today, 12/31/2014  Heparin level at low end of therapeutic range 0.33 on 1000 units/hr  No bleeding reported  Concurrent aspirin  daily noted  Goal of Therapy:  Heparin level 0.3-0.7 units/ml Monitor platelets by anticoagulation  protocol: Yes   Plan:   Increase heparin slightly to 1050 units/hr to prevent further drop  Recheck heparin level and CBC in 8hrs  Daily heparin level and CBC  Loralee Pacas, PharmD, BCPS Pager: [phone]  12/31/2014, 7:25 AM

## 2014-12-31 NOTE — Progress Notes (Signed)
Patient was placed on NRB. Patient is comfortable, vitals are stable, and no signs of distress are noted. O2 saturations are currently 100%. RT will continue to monitor.

## 2014-12-31 NOTE — Progress Notes (Signed)
ANTICOAGULATION CONSULT NOTE - Follow Up Consult  Pharmacy Consult for Heparin Indication: chest pain/ACS  Allergies  Allergen Reactions  . Penicillins Hives and Rash    Patient Measurements: Height:  (170.2 cm) Weight: 177 lb 7.5 oz (80.5 kg) IBW/kg (Calculated) : 66.1 Heparin Dosing Weight:   Vital Signs: Temp: 97.7 F (36.5 C) (10/04 0400) Temp Source: Axillary (10/04 0400) BP: 111/40 mmHg (10/04 0400) Pulse Rate: 62 (10/04 0400)  Labs:  Recent Labs  12/29/14 1211 12/29/14 1640 12/29/14 2302 12/30/14 0330 12/30/14 0705 12/30/14 1513 12/30/14 2356  HGB 12.7* 12.8*  --   --   --   --   --   HCT 37.6* 38.0*  --   --   --   --   --   PLT 206 215  --   --   --   --   --   APTT  --   --   --   --   --  48*  --   LABPROT  --   --   --   --   --  15.9*  --   INR  --   --   --   --   --  1.25  --   HEPARINUNFRC  --   --   --   --   --   --  0.36  CREATININE 1.67* 1.76*  --  1.79*  --   --   --   TROPONINI  --  0.72* 0.77*  --  2.98*  --   --     Estimated Creatinine Clearance: 29.6 mL/min (by C-G formula based on Cr of 1.79).   Medications:  Infusions:  . amiodarone 30 mg/hr (12/31/14 0026)  . heparin 1,000 Units/hr (12/30/14 1700)    Assessment: Patient with heparin level at goal.  No heparin issues noted.  Goal of Therapy:  Heparin level 0.3-0.7 units/ml Monitor platelets by anticoagulation protocol: Yes   Plan:  Continue heparin drip at current rate Recheck level with AM labs  Darlina Guys, Jacquenette Shone Crowford 12/31/2014,4:43 AM

## 2015-01-01 DIAGNOSIS — Z7189 Other specified counseling: Secondary | ICD-10-CM

## 2015-01-01 DIAGNOSIS — Z515 Encounter for palliative care: Secondary | ICD-10-CM

## 2015-01-01 DIAGNOSIS — R0600 Dyspnea, unspecified: Secondary | ICD-10-CM

## 2015-01-01 MED ORDER — POTASSIUM CHLORIDE CRYS ER 20 MEQ PO TBCR
40.0000 meq | EXTENDED_RELEASE_TABLET | Freq: Two times a day (BID) | ORAL | Status: DC
  Administered 2015-01-01 – 2015-01-02 (×3): 40 meq via ORAL
  Filled 2015-01-01 (×3): qty 2

## 2015-01-01 MED ORDER — MORPHINE SULFATE (PF) 2 MG/ML IV SOLN
1.0000 mg | INTRAVENOUS | Status: DC | PRN
  Administered 2015-01-01: 1 mg via INTRAVENOUS
  Filled 2015-01-01: qty 1

## 2015-01-01 MED ORDER — LORAZEPAM 2 MG/ML IJ SOLN: 1.0000 mg | INTRAMUSCULAR | Status: DC | PRN

## 2015-01-01 MED ORDER — POTASSIUM CHLORIDE 10 MEQ/100ML IV SOLN
10.0000 meq | INTRAVENOUS | Status: AC
  Administered 2015-01-01 (×4): 10 meq via INTRAVENOUS
  Filled 2015-01-01 (×4): qty 100

## 2015-01-01 MED ORDER — FUROSEMIDE 10 MG/ML IJ SOLN
80.0000 mg | Freq: Two times a day (BID) | INTRAMUSCULAR | Status: DC
  Administered 2015-01-01 – 2015-01-02 (×3): 80 mg via INTRAVENOUS
  Filled 2015-01-01 (×3): qty 8

## 2015-01-01 NOTE — Care Management Note (Signed)
Case Management Note  Patient Details  Name: Dale Munoz MRN: [id number] Date of Birth: 1924/05/09  Subjective/Objective:   PMT following.Noted CSW following for Residential Hospice.                 Action/Plan:d/c plan residential hospice.   Expected Discharge Date:                  Expected Discharge Plan:  Hospice Medical Facility  In-House Referral:  NA, Clinical Social Work  Discharge planning Services  CM Consult  Post Acute Care Choice:  NA Choice offered to:  NA  DME Arranged:    DME Agency:     HH Arranged:    HH Agency:     Status of Service:  In process, will continue to follow  Medicare Important Message Given:    Date Medicare IM Given:    Medicare IM give by:    Date Additional Medicare IM Given:    Additional Medicare Important Message give by:     If discussed at Long Length of Stay Meetings, dates discussed:    Additional Comments:  Lanier Clam, RN 01/01/2015, 2:37 PM

## 2015-01-01 NOTE — Progress Notes (Signed)
Mr. Leeon was lying in bed and awake when I arrived. He was assisted w/oxygen mask. He said he had had a difficult time breathing today, but he was going to be ok. He said he knows his time is coming and he has been ready for a while. I provided support and presence as he spoke about his condition. He is very pleasant and was appreciative of Chaplain visit and support. Chaplain Elmarie Shiley Holder   01/01/15 1700  Clinical Encounter Type  Visited With Patient

## 2015-01-01 NOTE — Clinical Social Work Note (Signed)
Clinical Social Work Assessment  Patient Details  Name: Dale Munoz MRN: [id number] Date of Birth: 1924/05/03  Date of referral:  01/01/15               Reason for consult:  Discharge Planning                Permission sought to share information with:  Facility Art therapist granted to share information::  Yes, Verbal Permission Granted  Name::        Agency::     Relationship::     Contact Information:     Housing/Transportation Living arrangements for the past 2 months:  Single Family Home Source of Information:  Other (Comment Required) (Brother) Patient Interpreter Needed:  None Criminal Activity/Legal Involvement Pertinent to Current Situation/Hospitalization:  No - Comment as needed Significant Relationships:  Siblings Lives with:  Roommate Do you feel safe going back to the place where you live?  No (Residential Hospice needed.) Need for family participation in patient care:  Yes (Comment)  Care giving concerns: Pt's care cannot be managed at home following hospital d/c.   Social Worker assessment / plan:  Pt hospitalized on 12/29/14 with multiple medical concerns including respiratory failure. CSW consulted to assist with residential hospice placement. Palliative Care Team has met with pt / brother to determine [place] and disposition. Pt / brother are in agreement with Residential Hospice Home placement and have requested [facility]. Erling Conte, LCSW, from [facility] has been contacted and referral provided. A decision regarding acceptance to [facility] is pending.  Employment status:  Retired Forensic scientist:  Medicare PT Recommendations:  Not assessed at this time [place] / Referral to community resources:  Other (Comment Required) (Residential hospice info)  Patient/Family's Response to care: Pt / brother feel residential hospice placement is needed.  Patient/Family's Understanding of and Emotional Response to Diagnosis, Current  Treatment, and Prognosis: Pt / brother are aware of pt's medical status. Documentation indicates both pt / brother are in  acceptance of end of life care.  Emotional Assessment Appearance:  Appears stated age Attitude/Demeanor/Rapport:  Other (cooperative) Affect (typically observed):  Calm Orientation:  Oriented to Self, Oriented to Place, Oriented to  Time, Oriented to Situation Alcohol / Substance use:  Not Applicable Psych involvement (Current and /or in the community):  No (Comment)  Discharge Needs  Concerns to be addressed:  Discharge Planning Concerns Readmission within the last 30 days:  No Current discharge risk:  None Barriers to Discharge:  No Barriers Identified   Luretha Rued, [place] 01/01/2015, 3:59 PM

## 2015-01-01 NOTE — Progress Notes (Signed)
Brother, [name], took his clothes, wallet, and phone home with him.

## 2015-01-01 NOTE — Progress Notes (Signed)
RNCM contacted CSW 10/4 for assistance with d/c planning. RNCM questioned if pt could go to SNF at d/c. CSW reported that pt could go to SNF for [facility] which would be covered  by medicare. If long term care is needed medicare will not cover and pt will need to pay out of pocket for placement. PT eval will be needed to see if pt is able to participate in rehab. RNCM notes from 10/4 indicate possible LTAC placement. CSW is available to assist with d/c planning, as needed.  Cori Razor LCSW [phone]

## 2015-01-01 NOTE — Progress Notes (Signed)
[facility] PROGRESS NOTE    Progress Note   Dale Munoz ZOX:[id number] DOB: March 08, 1925 DOA: 12/29/2014 PCP: Loreen Freud, DO   Brief Narrative:   Dale Munoz is an 81 y.o. male comes into the hospital with orthopnea and diagnosed with a non-ST elevation MI, started on IV heparin, aspirin, statin and beta blocker cardiology was consulted the patient did not want aggressive treatment. A 2-D echo was done that showed wall motion abnormalities.  Assessment/Plan:     Acute respiratory failure with hypoxia (HCC) multifactorial  Acute systolic CHF (congestive heart failure), NYHA class 2 (HCC) in the setting of non-ST elevation MI: -Back on BIPAP this am, was on NRM most of the night -Cardiology following -Continue IV lasix, BB and low dose ACE -negative 1.9L -ECHO with EF of 30% to 35%. Akinesis of the apicalinferolateral and apical myocardium and Grade 2 diastolic dysfunction -Roxanol for dyspnea too -Pt declines cath or any aggressive management -will consult Palliative care for goals, symptom management -wants comfort focused care with the goal of going home but with current O2/BIPAP needs doubt this will be possible, SNF with Palliative care vs Residential Hospice may need to be options  NSTEMI -as above  CKD 3 -stable  Hypothyroidism: Continue Synthroid.  Hyperlipidemia: Continue statins.  Hypokalemia: -replace    DVT Prophylaxis - Lovenox   Family Communication: none Disposition Plan: ? SNF with palliative care vs Residential Hospice if doesn't turn around Code Status: DNR    Code Status Orders        Start     Ordered   12/29/14 1544  Full code   Continuous     12/29/14 1543        IV Access:    Peripheral IV   Procedures and diagnostic studies:   Ct Chest Wo Contrast  01-12-2015   CLINICAL DATA:  Acute respiratory failure with hypoxia, acute renal failure, acute systolic CHF, pulmonary edema, asthma, hypertension, heart disease   EXAM: CT CHEST WITHOUT CONTRAST  TECHNIQUE: Multidetector CT imaging of the chest was performed following the standard protocol without IV contrast. Sagittal and coronal MPR images reconstructed from axial data set. IV contrast not administered due to renal dysfunction.  COMPARISON:  Chest radiograph 12/29/2014  FINDINGS: Atherosclerotic calcifications aorta, coronary arteries, and proximal great vessels.  Aorta normal caliber.  Upper normal size RIGHT paratracheal node 10 mm short axis image 22.  No thoracic adenopathy.  Multiple gallstones in gallbladder, largest up to 18 mm diameter.  Remaining visualized portion of upper abdomen unremarkable.  BILATERAL pleural effusions, moderate size, slightly larger on LEFT.  Compressive atelectasis of LEFT lower lobe.  Patchy airspace infiltrates identified bilaterally throughout all lobes greatest in RIGHT upper lobe.  No discrete pulmonary mass or pneumothorax.  Diffuse osseous demineralization with scattered degenerative disc disease changes thoracic spine.  IMPRESSION: Moderate BILATERAL pleural effusions larger on LEFT.  BILATERAL airspace infiltrates greatest in RIGHT upper lobe, question asymmetric edema versus pneumonia.  Cholelithiasis.  Extensive atherosclerotic disease including coronary arteries.   Electronically Signed   By: Ulyses Southward M.D.   On: 01/12/15 13:19     Medical Consultants:    None.  Anti-Infectives:   Anti-infectives    Start     Dose/Rate Route Frequency Ordered Stop   01/12/2015 0900  doxycycline (VIBRAMYCIN) 100 mg in dextrose 5 % 250 mL IVPB  Status:  Discontinued     100 mg 125 mL/hr over 120 Minutes Intravenous Every 12 hours 12-Jan-2015 0757 12/31/14  9604   12/30/14 0830  azithromycin (ZITHROMAX) 500 mg in dextrose 5 % 250 mL IVPB  Status:  Discontinued     500 mg 250 mL/hr over 60 Minutes Intravenous Every 24 hours 12/30/14 0724 12/30/14 0757   12/30/14 0800  cefTRIAXone (ROCEPHIN) 1 g in dextrose 5 % 50 mL IVPB  Status:   Discontinued     1 g 100 mL/hr over 30 Minutes Intravenous Every 24 hours 12/30/14 0724 12/31/14 0747      Subjective:    Dale Munoz reports breathing improving while on BIPAP  Objective:    Filed Vitals:   01/01/15 0315 01/01/15 0400 01/01/15 0500 01/01/15 0717  BP: 126/41 119/40  119/40  Pulse: 74   77  Temp:  98.7 F (37.1 C)    TempSrc:  Axillary    Resp: 37 22  28  Height:      Weight:   79.2 kg (174 lb 9.7 oz)   SpO2: 92% 93%  90%    Intake/Output Summary (Last 24 hours) at 01/01/15 0730 Last data filed at 01/01/15 0400  Gross per 24 hour  Intake  793.4 ml  Output   1426 ml  Net -632.6 ml   Filed Weights   12/30/14 0500 12/31/14 0400 01/01/15 0500  Weight: 80.6 kg (177 lb 11.1 oz) 80.5 kg (177 lb 7.5 oz) 79.2 kg (174 lb 9.7 oz)    Exam: Gen:  AAOx3, on BIPAP Cardiovascular:  RRR,+JVD Respiratory:  Good air movement with crackles in lungs bilaterally. Gastrointestinal:  Abdomen soft, NT/ND, + BS Extremities:  No C/E/C   Data Reviewed:    Labs: Basic Metabolic Panel:  Recent Labs Lab 12/29/14 1211 12/29/14 1640 12/30/14 0330 12/31/14 0409  NA 139  --  141 141  K 3.3*  --  3.6 3.2*  CL 100*  --  102 103  CO2 27  --  29 29  GLUCOSE 119*  --  121* 121*  BUN 27*  --  29* 32*  CREATININE 1.67* 1.76* 1.79* 1.88*  CALCIUM 8.8*  --  8.2* 8.2*   GFR Estimated Creatinine Clearance: 25.9 mL/min (by C-G formula based on Cr of 1.88). Liver Function Tests: No results for input(s): AST, ALT, ALKPHOS, BILITOT, PROT, ALBUMIN in the last 168 hours. No results for input(s): LIPASE, AMYLASE in the last 168 hours. No results for input(s): AMMONIA in the last 168 hours. Coagulation profile  Recent Labs Lab 12/30/14 1513  INR 1.25    CBC:  Recent Labs Lab 12/29/14 1211 12/29/14 1640  WBC 13.2* 15.2*  HGB 12.7* 12.8*  HCT 37.6* 38.0*  MCV 96.9 96.9  PLT 206 215   Cardiac Enzymes:  Recent Labs Lab 12/29/14 1640 12/29/14 2302  12/30/14 0705  TROPONINI 0.72* 0.77* 2.98*   BNP (last 3 results) No results for input(s): PROBNP in the last 8760 hours. CBG: No results for input(s): GLUCAP in the last 168 hours. D-Dimer: No results for input(s): DDIMER in the last 72 hours. Hgb A1c: No results for input(s): HGBA1C in the last 72 hours. Lipid Profile: No results for input(s): CHOL, HDL, LDLCALC, TRIG, CHOLHDL, LDLDIRECT in the last 72 hours. Thyroid function studies: No results for input(s): TSH, T4TOTAL, T3FREE, THYROIDAB in the last 72 hours.  Invalid input(s): FREET3 Anemia work up: No results for input(s): VITAMINB12, FOLATE, FERRITIN, TIBC, IRON, RETICCTPCT in the last 72 hours. Sepsis Labs:  Recent Labs Lab 12/29/14 1211 12/29/14 1640  WBC 13.2* 15.2*   Microbiology Recent Results (  from the past 240 hour(s))  MRSA PCR Screening     Status: None   Collection Time: 12/29/14 11:48 PM  Result Value Ref Range Status   MRSA by PCR NEGATIVE NEGATIVE Final    Comment:        The GeneXpert MRSA Assay (FDA approved for NASAL specimens only), is one component of a comprehensive MRSA colonization surveillance program. It is not intended to diagnose MRSA infection nor to guide or monitor treatment for MRSA infections.   Culture, blood (routine x 2) Call MD if unable to obtain prior to antibiotics being given     Status: None (Preliminary result)   Collection Time: 12/30/14  8:30 AM  Result Value Ref Range Status   Specimen Description BLOOD RIGHT ARM  Final   Special Requests BOTTLES DRAWN AEROBIC AND ANAEROBIC  Final   Culture   Final    NO GROWTH 1 DAY Performed at [facility]    Report Status PENDING  Incomplete  Culture, blood (routine x 2) Call MD if unable to obtain prior to antibiotics being given     Status: None (Preliminary result)   Collection Time: 12/30/14  8:40 AM  Result Value Ref Range Status   Specimen Description BLOOD RIGHT ARM  Final   Special Requests BOTTLES  DRAWN AEROBIC ONLY 9CC  Final   Culture   Final    NO GROWTH 1 DAY Performed at [facility]    Report Status PENDING  Incomplete     Medications:   . amiodarone  200 mg Oral BID  . antiseptic oral rinse  7 mL Mouth Rinse q12n4p  . aspirin  325 mg Oral Daily  . atorvastatin  40 mg Oral q1800  . chlorhexidine  15 mL Mouth Rinse BID  . furosemide  80 mg Intravenous Q12H  . levothyroxine  50 mcg Oral QAC breakfast  . lisinopril  2.5 mg Oral Daily  . metoprolol tartrate  12.5 mg Oral BID  . pantoprazole  40 mg Oral Daily  . potassium chloride  10 mEq Intravenous Q1 Hr x 4  . potassium chloride  40 mEq Oral BID   Continuous Infusions:    Time spent: 35 min   LOS: 3 days   Gennette Shadix  [facility] Pager [phone]  *Please refer to [email or web address], password [email or web address] to get updated schedule on who will round on this patient, as hospitalists switch teams weekly. If 7PM-7AM, please contact night-coverage at [email or web address], password [email or web address] for any overnight needs.  01/01/2015, 7:30 AM

## 2015-01-01 NOTE — Progress Notes (Signed)
RT attempted to place patient on HFNC at 15 L.  Patient O2 saturations were 90-92%, but patient placed back on BiPAP due to desaturation to 80%. Patient O2 is currently 96%. MD is currently at beside; MD and RN notified of RT attempt to wean to HFNC. RT will continue to monitor patient.

## 2015-01-01 NOTE — Consult Note (Signed)
Consultation Note Date: 01/01/2015   Patient Name: Dale Munoz  DOB: 05/08/1924  MRN: [id number]  Age / Sex: 81 y.o., male   PCP: Lelon Perla, DO Referring Physician: Zannie Cove, MD  Reason for Consultation: Establishing goals of care, Non pain symptom management, Pain control and Psychosocial/spiritual support  Palliative Care Assessment and Plan Summary of Established Goals of Care and Medical Treatment Preferences    Palliative Care Discussion Held Today:    This NP Lorinda Creed reviewed medical records, received report from team, assessed the patient and then meet at the patient's bedside along with brother Dale Munoz # [phone]   to discuss diagnosis prognosis, GOC, EOL wishes disposition and options.  A detailed discussion was had today regarding advanced directives.  Concepts specific to code status, artifical feeding and hydration, continued IV antibiotics and rehospitalization was had.  The difference between a aggressive medical intervention path  and a palliative comfort care path for this patient at this time was had.  Values and goals of care important to patient and family were attempted to be elicited.  Concept of Hospice and Palliative Care were discussed  Natural trajectory and expectations at EOL were discussed.  Questions and concerns addressed.  Hard Choices booklet left for review. Family encouraged to call with questions or concerns.  PMT will continue to support holistically.  Patient and his brother were very comfortable talking about death and dying.  They grew up with their father being a Research officer, trade union.  Patient does not want any life prolonging interventions once discharged to a hospice facility.  We discussed that it is likely that he will quickly decline/desaturate without BiPap support but that medications would be used at that point in time to enhance comfort, "that sounds good to me"   Primary Decision Maker: patient at this time with support of  his brother  Goals of Care/Code Status/Advance Care Planning:   Code Status:  DNR/DNI  BiPap-utilize until discharge to hospice facility, then treat symptomatically/O2 by nasal cannula at that point   Artificial feeding/hydration: no artifical feeding now or in the furture  Antibiotics: none  Diagnostics:none  Rehospitalization:avoid   Symptom Management:    Agitaiton:  Ativan 1 mg every   Pain/Dyspnea:  Morphine 1 mg IV every 1 hrs prn-consider a continuous gtt   Psycho-social/Spiritual:    Support System: family   Prognosis: < 2 weeks  Discharge Planning:  Hospice facility, will write for choice       Chief Complaint: Shortness of breath  History of Present Illness:   81 y.o. male with past medical history of coronary artery disease unknown, heart failure, essential hypertension. Admitted diagnosed with a non-ST elevation MI, started on IV heparin, aspirin, statin and beta blocker cardiology was consulted. Continues to require O2/Bipap for respiratory support  Faced with advanced directive decisions and anticipatory care needs    Primary Diagnoses  Present on Admission:  . Acute combined systolic and diastolic heart failure (HCC) . Acute respiratory failure with hypoxia (HCC) . Hyperlipidemia . Hypothyroidism . Hypokalemia . Troponin level elevated . RBBB . Essential hypertension . Diastolic dysfunction-grade 2 . Carotid stenosis- pt declined angiogram  Palliative Review of Systems:    -weakness, fatigue, dyspnea at rest   I have reviewed the medical record, interviewed the patient and family, and examined the patient. The following aspects are pertinent.  Past Medical History  Diagnosis Date  . Asthma   . Chicken pox   . Stroke (HCC)   . Kidney  disease   . Heart disease   . HTN (hypertension)   . Hyperlipemia   . Hypothyroidism   . Gallstones   . Hypertension   . Kidney stones   . Sleep apnea    Social History   Social History   . Marital Status: Widowed    Spouse Name: N/A  . Number of Children: 2  . Years of Education: N/A   Occupational History  . Aviation    Social History Main Topics  . Smoking status: Never Smoker   . Smokeless tobacco: Never Used  . Alcohol Use: No  . Drug Use: No  . Sexual Activity: No   Other Topics Concern  . None   Social History Narrative   Family History  Problem Relation Age of Onset  . Colon cancer Neg Hx   . Colon polyps Neg Hx   . Kidney disease Neg Hx   . Diabetes Neg Hx   . Esophageal cancer Neg Hx   . Heart disease Neg Hx   . Gallbladder disease Neg Hx   . Sudden death Mother   . Sudden death Father    Scheduled Meds: . amiodarone  200 mg Oral BID  . antiseptic oral rinse  7 mL Mouth Rinse q12n4p  . aspirin  325 mg Oral Daily  . atorvastatin  40 mg Oral q1800  . chlorhexidine  15 mL Mouth Rinse BID  . furosemide  80 mg Intravenous Q12H  . levothyroxine  50 mcg Oral QAC breakfast  . lisinopril  2.5 mg Oral Daily  . metoprolol tartrate  12.5 mg Oral BID  . pantoprazole  40 mg Oral Daily  . potassium chloride  40 mEq Oral BID   Continuous Infusions:  PRN Meds:.acetaminophen, acetaminophen, morphine injection, zolpidem Medications Prior to Admission:  Prior to Admission medications   Medication Sig Start Date End Date Taking? Authorizing Provider  amiodarone (PACERONE) 200 MG tablet Take 0.5 tablets (100 mg total) by mouth daily. Patient taking differently: Take 200 mg by mouth daily.  06/10/14  Yes Yvonne R Lowne, DO  amLODipine-benazepril (LOTREL) 5-20 MG capsule Take 1 capsule by mouth daily.   Yes Historical Provider, MD  aspirin EC 81 MG tablet Take 81 mg by mouth daily.   Yes Historical Provider, MD  Cholecalciferol (VITAMIN D) 2000 UNITS CAPS Take by mouth.   Yes Historical Provider, MD  levothyroxine (SYNTHROID, LEVOTHROID) 50 MCG tablet Take 1 tablet (50 mcg total) by mouth daily before breakfast. 09/20/14  Yes Grayling Congress Lowne, DO  naproxen  sodium (ANAPROX) 220 MG tablet Take 220 mg by mouth 2 (two) times daily as needed (shoulder pain).   Yes Historical Provider, MD  torsemide (DEMADEX) 20 MG tablet Take 1 tablet (20 mg total) by mouth daily. Dose change. D/C PREVIOUS SCRIPTS FOR THIS MEDICATION 09/20/14  Yes Yvonne R Lowne, DO  zolpidem (AMBIEN CR) 6.25 MG CR tablet Take 1 tablet (6.25 mg total) by mouth at bedtime as needed for sleep. 09/20/14  Yes Yvonne R Lowne, DO  amLODipine (NORVASC) 10 MG tablet Take 1 tablet (10 mg total) by mouth daily. 07/25/14   Lelon Perla, DO   Allergies  Allergen Reactions  . Penicillins Hives and Rash   CBC:    Component Value Date/Time   WBC 15.2* 12/29/2014 1640   HGB 12.8* 12/29/2014 1640   HCT 38.0* 12/29/2014 1640   PLT 215 12/29/2014 1640   MCV 96.9 12/29/2014 1640   NEUTROABS 6.2 06/20/2014 1416  LYMPHSABS 1.4 06/20/2014 1416   MONOABS 0.7 06/20/2014 1416   EOSABS 0.4 06/20/2014 1416   BASOSABS 0.1 06/20/2014 1416   Comprehensive Metabolic Panel:    Component Value Date/Time   NA 141 12/31/2014 0409   K 3.2* 12/31/2014 0409   CL 103 12/31/2014 0409   CO2 29 12/31/2014 0409   BUN 32* 12/31/2014 0409   CREATININE 1.88* 12/31/2014 0409   GLUCOSE 121* 12/31/2014 0409   CALCIUM 8.2* 12/31/2014 0409   AST 29 07/25/2014 0944   ALT 20 07/25/2014 0944   ALKPHOS 207* 07/25/2014 0944   BILITOT 0.8 07/25/2014 0944   PROT 6.6 07/25/2014 0944   ALBUMIN 3.7 07/25/2014 0944    Physical Exam:  Vital Signs: BP 115/43 mmHg  Pulse 77  Temp(Src) 98 F (36.7 C) (Axillary)  Resp 32  Ht 5\' 7"  (1.702 m)  Wt 79.2 kg (174 lb 9.7 oz)  BMI 27.34 kg/m2  SpO2 95% SpO2: SpO2: 95 % O2 Device: O2 Device: Bi-PAP O2 Flow Rate: O2 Flow Rate (L/min): 15 L/min Intake/output summary:  Intake/Output Summary (Last 24 hours) at 01/01/15 1239 Last data filed at 01/01/15 1100  Gross per 24 hour  Intake    640 ml  Output   2025 ml  Net  -1385 ml   LBM: Last BM Date: 12/29/14 Baseline Weight:  Weight: 81.194 kg (179 lb) Most recent weight: Weight: 79.2 kg (174 lb 9.7 oz)  Exam Findings:   General: chronically ill appearing, HEENT: moist buccal membranes, no exudate CVS: RRR Resp: dyspneic at rest, quickly desaturates into the 80'2 without BiPap support Abd: soft NT +BS Extrem: without edema Skin:warm and dry Neuro: alert and oriented X3           Palliative Performance Scale: 30 %                Additional Data Reviewed: Recent Labs     12/29/14  1640  12/30/14  0330  12/31/14  0409  WBC  15.2*   --    --   HGB  12.8*   --    --   PLT  215   --    --   NA   --   141  141  BUN   --   29*  32*  CREATININE  1.76*  1.79*  1.88*     Time In: 1300 Time Out: 1430 Time Total: 90 min  Greater than 50%  of this time was spent counseling and coordinating care related to the above assessment and plan.  Discussed with  Dr  Jomarie Longs  Signed by: Lorinda Creed, NP  Canary Brim, NP  01/01/2015, 12:39 PM  Please contact Palliative Medicine Team phone at [phone] for questions and concerns.   See AMION for contact information

## 2015-01-01 NOTE — Progress Notes (Signed)
PT Cancellation Note  Patient Details Name: [name] MRN: [id number] DOB: [birth year]   Cancelled Treatment:    Reason Eval/Treat Not Completed: Medical issues which prohibited therapy (patient is between HFNC and BiPAP. Progressing mobility with such respiratoy compromise is concerning. Uncertain of goals for mobility. will reassess in AM. )   Rada Hay 01/01/2015, 3:56 PM Blanchard Kelch PT [phone]

## 2015-01-01 NOTE — Plan of Care (Signed)
Problem: Phase I Progression Outcomes Goal: Initial discharge plan identified Outcome: Completed/Met Date Met:  01/01/15 Will be transferred to  [facility] for EOL care

## 2015-01-01 NOTE — Consult Note (Signed)
[facility] [employer]  Received request from Dale Munoz for family interest in [facility]. Chart reviewed and appreciate report from PMT NP Wadie Lessen. Met briefly with patient to confirm interest. He requested I contact his brother to complete paper work, which I did. We met in ED lobby at his brother's request. His shared his expectations for comfort care/symptom management and shared his  understanding that patient will not be on bipap at [facility]. He completed paper work in [facility] of transfer to [facility]. Dr. Orpah Melter to assume care per brother's request.   Please fax discharge summary to [phone].  RN please call report to [phone].  Thank you. Erling Conte, [place]

## 2015-01-02 DIAGNOSIS — I509 Heart failure, unspecified: Secondary | ICD-10-CM

## 2015-01-02 DIAGNOSIS — Z8673 Personal history of transient ischemic attack (TIA), and cerebral infarction without residual deficits: Secondary | ICD-10-CM

## 2015-01-02 DIAGNOSIS — I10 Essential (primary) hypertension: Secondary | ICD-10-CM

## 2015-01-02 DIAGNOSIS — Z7189 Other specified counseling: Secondary | ICD-10-CM

## 2015-01-02 LAB — BASIC METABOLIC PANEL
Anion gap: 7 (ref 5–15)
BUN: 44 mg/dL — AB (ref 6–20)
CALCIUM: 8.4 mg/dL — AB (ref 8.9–10.3)
CHLORIDE: 104 mmol/L (ref 101–111)
CO2: 28 mmol/L (ref 22–32)
CREATININE: 2.13 mg/dL — AB (ref 0.61–1.24)
GFR calc Af Amer: 30 mL/min — ABNORMAL LOW (ref >60)
GFR calc non Af Amer: 26 mL/min — ABNORMAL LOW (ref >60)
GLUCOSE: 120 mg/dL — AB (ref 65–99)
Potassium: 4.3 mmol/L (ref 3.5–5.1)
Sodium: 139 mmol/L (ref 135–145)

## 2015-01-02 LAB — CBC
HEMATOCRIT: 33.7 % — AB (ref 39.0–52.0)
HEMOGLOBIN: 11.2 g/dL — AB (ref 13.0–17.0)
MCH: 33 pg (ref 26.0–34.0)
MCHC: 33.2 g/dL (ref 30.0–36.0)
MCV: 99.4 fL (ref 78.0–100.0)
Platelets: 195 10*3/uL (ref 150–400)
RBC: 3.39 MIL/uL — ABNORMAL LOW (ref 4.22–5.81)
RDW: 14.8 % (ref 11.5–15.5)
WBC: 12.4 10*3/uL — ABNORMAL HIGH (ref 4.0–10.5)

## 2015-01-02 MED ORDER — LORAZEPAM 2 MG/ML IJ SOLN: 1.0000 mg | INTRAMUSCULAR | Status: AC | PRN

## 2015-01-02 MED ORDER — MORPHINE SULFATE (PF) 2 MG/ML IV SOLN: 1.0000 mg | INTRAVENOUS | Status: AC | PRN

## 2015-01-04 LAB — CULTURE, BLOOD (ROUTINE X 2)
Culture: NO GROWTH
Culture: NO GROWTH

## 2015-01-28 NOTE — Progress Notes (Signed)
PT Cancellation Note  Patient Details Name: Dale Munoz MRN: [id number] DOB: 18-Oct-1924   Cancelled Treatment:    Reason Eval/Treat Not Completed: PT screened, no needs identified, will sign off (to transfer to [facility]. )   Waelder, Jobe Igo January 15, 2015, 8:30 AM Blanchard Kelch PT [phone]

## 2015-01-28 NOTE — Discharge Summary (Signed)
Physician Discharge Summary  Dale Munoz:[id number] DOB: Jul 26, 1924 DOA: 12/29/2014  PCP: Loreen Freud, DO  Admit date: 12/29/2014 Discharge date: 01/07/2015  Time spent: 45 minutes  Recommendations for Outpatient Follow-up:  [facility]  Discharge Diagnoses:  Principal Problem:   Acute respiratory failure with hypoxia [facility]) Active Problems:   Acute Systolic CHF   NSTEMI   Hypothyroidism   Hyperlipidemia   Acute combined systolic and diastolic heart failure (HCC)   Hypokalemia   Troponin level elevated   Atrial tachycardia (HCC)   RBBB   Essential hypertension   Diastolic dysfunction-grade 2   History of stroke-remote   DNR (do not resuscitate) discussion   Dyspnea   Palliative care encounter   Discharge Condition: poor  Diet recommendation: comfort feeds/regular  Filed Weights   12/30/14 0500 12/31/14 0400 01/01/15 0500  Weight: 80.6 kg (177 lb 11.1 oz) 80.5 kg (177 lb 7.5 oz) 79.2 kg (174 lb 9.7 oz)    History of present illness:  Chief complaint: Dyspnea Dale Munoz is an 81 y.o. male with past medical history of coronary artery disease unknown, heart failure, essential hypertension that presented to the ER due to Dyspnea that started the day prior to admission. He reported that every time he would try to go to sleep he will feel short of breath and had to sit up in a chair.  In ER, He was found to be hypoxic with acute renal failure,BNP and mild elevation in troponin of 0.6 with a chest x-ray showed pulmonary edema  Hospital Course:  Acute respiratory failure with hypoxia: due to Acute systolic CHF (congestive heart failure), in setting of Non-ST elevation MI: -was requiring BIPAP or Non rebreather mask to keep O2 sats >90 and help with work of breathing -was seen by Cardiology in consultation, treated with IV lasix, Heparin, beta blockers, ACE -ECHO with EF of 30% to 35%. Akinesis of the apicalinferolateral and apical myocardium  and Grade 2 diastolic dysfunction -Pt declined cath or any aggressive management -Palliative care was consulted for goals and symptom management, patient and family decided to focus on comfort only and decision made for Residential H[facility] at [facility] -wants comfort focused care with the goal of going home but with current O2/BIPAP needs doubt this will be possible, SNF with Palliative care vs Residential Hospice may need to be options  NSTEMI -as above  CKD 3 -stable  Hypothyroidism: -now comfort care  Hyperlipidemia: -now comfort care  Hypokalemia: -replaced  Consultations:  Cardiology  Palliative medicine  Discharge Exam: Filed Vitals:   07-Jan-2015 0800  BP: 110/31  Pulse:   Temp: 98.3 F (36.8 C)  Resp: 32    General: AAOx3 Cardiovascular:S1S2/RRR Respiratory: crackles at both bases  Discharge Instructions   Discharge Instructions    Diet general    Complete by:  As directed      Increase activity slowly    Complete by:  As directed           Current Discharge Medication List    START taking these medications   Details  LORazepam (ATIVAN) 2 MG/ML injection Inject 0.5 mLs (1 mg total) into the vein every 4 (four) hours as needed for anxiety. Qty: 1 mL, Refills: 0    morphine 2 MG/ML injection Inject 0.5 mLs (1 mg total) into the vein every hour as needed (dyspnea). Qty: 1 mL, Refills: 0      CONTINUE these medications which have NOT CHANGED   Details  aspirin EC 81 MG tablet Take 81 mg by mouth daily.    levothyroxine (SYNTHROID, LEVOTHROID) 50 MCG tablet Take 1 tablet (50 mcg total) by mouth daily before breakfast. Qty: 90 tablet, Refills: 1    zolpidem (AMBIEN CR) 6.25 MG CR tablet Take 1 tablet (6.25 mg total) by mouth at bedtime as needed for sleep. Qty: 90 tablet, Refills: 0      STOP taking these medications     amiodarone (PACERONE) 200 MG tablet      amLODipine-benazepril (LOTREL) 5-20 MG capsule      Cholecalciferol (VITAMIN  D) 2000 UNITS CAPS      naproxen sodium (ANAPROX) 220 MG tablet      torsemide (DEMADEX) 20 MG tablet      amLODipine (NORVASC) 10 MG tablet        Allergies  Allergen Reactions  . Penicillins Hives and Rash      The results of significant diagnostics from this hospitalization (including imaging, microbiology, ancillary and laboratory) are listed below for reference.    Significant Diagnostic Studies: Ct Chest Wo Contrast  12/30/2014   CLINICAL DATA:  Acute respiratory failure with hypoxia, acute renal failure, acute systolic CHF, pulmonary edema, asthma, hypertension, heart disease  EXAM: CT CHEST WITHOUT CONTRAST  TECHNIQUE: Multidetector CT imaging of the chest was performed following the standard protocol without IV contrast. Sagittal and coronal MPR images reconstructed from axial data set. IV contrast not administered due to renal dysfunction.  COMPARISON:  Chest radiograph 12/29/2014  FINDINGS: Atherosclerotic calcifications aorta, coronary arteries, and proximal great vessels.  Aorta normal caliber.  Upper normal size RIGHT paratracheal node 10 mm short axis image 22.  No thoracic adenopathy.  Multiple gallstones in gallbladder, largest up to 18 mm diameter.  Remaining visualized portion of upper abdomen unremarkable.  BILATERAL pleural effusions, moderate size, slightly larger on LEFT.  Compressive atelectasis of LEFT lower lobe.  Patchy airspace infiltrates identified bilaterally throughout all lobes greatest in RIGHT upper lobe.  No discrete pulmonary mass or pneumothorax.  Diffuse osseous demineralization with scattered degenerative disc disease changes thoracic spine.  IMPRESSION: Moderate BILATERAL pleural effusions larger on LEFT.  BILATERAL airspace infiltrates greatest in RIGHT upper lobe, question asymmetric edema versus pneumonia.  Cholelithiasis.  Extensive atherosclerotic disease including coronary arteries.   Electronically Signed   By: Ulyses Southward M.D.   On: 12/30/2014  13:19   Dg Chest Port 1 View  12/29/2014   CLINICAL DATA:  SOB since last p.m. and bilateral crackles on exam. Pt was seen at [facility] for broken clavicle/shoulder last Friday and was given an arm sling. H/o asthma, stroke, HTN. Nonsmoker.  EXAM: PORTABLE CHEST - 1 VIEW  COMPARISON:  06/03/2009  FINDINGS: Airspace opacities in both lung bases left worse than right. Stable cardiomegaly. Tortuous thoracic aorta. Perihilar interstitial prominence may represent congestion or infiltrate. Septal lines suggested peripherally on the right. No definite effusion.  Degenerative changes in the right shoulder and bilateral AC joints.  IMPRESSION: 1. Bilateral interstitial edema or infiltrates with asymmetric basilar airspace disease, left worse than right.   Electronically Signed   By: Corlis Leak M.D.   On: 12/29/2014 12:58    Microbiology: Recent Results (from the past 240 hour(s))  MRSA PCR Screening     Status: None   Collection Time: 12/29/14 11:48 PM  Result Value Ref Range Status   MRSA by PCR NEGATIVE NEGATIVE Final    Comment:        The GeneXpert MRSA Assay (FDA  approved for NASAL specimens only), is one component of a comprehensive MRSA colonization surveillance program. It is not intended to diagnose MRSA infection nor to guide or monitor treatment for MRSA infections.   Culture, blood (routine x 2) Call MD if unable to obtain prior to antibiotics being given     Status: None (Preliminary result)   Collection Time: 12/30/14  8:30 AM  Result Value Ref Range Status   Specimen Description BLOOD RIGHT ARM  Final   Special Requests BOTTLES DRAWN AEROBIC AND ANAEROBIC  Final   Culture   Final    NO GROWTH 2 DAYS Performed at [facility]    Report Status PENDING  Incomplete  Culture, blood (routine x 2) Call MD if unable to obtain prior to antibiotics being given     Status: None (Preliminary result)   Collection Time: 12/30/14  8:40 AM  Result Value Ref Range Status   Specimen  Description BLOOD RIGHT ARM  Final   Special Requests BOTTLES DRAWN AEROBIC ONLY 9CC  Final   Culture   Final    NO GROWTH 2 DAYS Performed at [facility]    Report Status PENDING  Incomplete     Labs: Basic Metabolic Panel:  Recent Labs Lab 12/29/14 1211 12/29/14 1640 12/30/14 0330 12/31/14 0409 January 09, 2015 0348  NA 139  --  141 141 139  K 3.3*  --  3.6 3.2* 4.3  CL 100*  --  102 103 104  CO2 27  --  29 29 28   GLUCOSE 119*  --  121* 121* 120*  BUN 27*  --  29* 32* 44*  CREATININE 1.67* 1.76* 1.79* 1.88* 2.13*  CALCIUM 8.8*  --  8.2* 8.2* 8.4*   Liver Function Tests: No results for input(s): AST, ALT, ALKPHOS, BILITOT, PROT, ALBUMIN in the last 168 hours. No results for input(s): LIPASE, AMYLASE in the last 168 hours. No results for input(s): AMMONIA in the last 168 hours. CBC:  Recent Labs Lab 12/29/14 1211 12/29/14 1640 Jan 09, 2015 0348  WBC 13.2* 15.2* 12.4*  HGB 12.7* 12.8* 11.2*  HCT 37.6* 38.0* 33.7*  MCV 96.9 96.9 99.4  PLT 206 215 195   Cardiac Enzymes:  Recent Labs Lab 12/29/14 1640 12/29/14 2302 12/30/14 0705  TROPONINI 0.72* 0.77* 2.98*   BNP: BNP (last 3 results)  Recent Labs  12/29/14 1211  BNP 1047.3*    ProBNP (last 3 results) No results for input(s): PROBNP in the last 8760 hours.  CBG: No results for input(s): GLUCAP in the last 168 hours.     SignedZannie Cove  [facility] 01/09/15, 9:24 AM

## 2015-01-28 DEATH — deceased

## 2015-01-31 ENCOUNTER — Other Ambulatory Visit: Payer: Self-pay | Admitting: Family Medicine

## 2015-03-18 ENCOUNTER — Other Ambulatory Visit: Payer: Self-pay | Admitting: Family Medicine

## 2015-03-18 NOTE — Telephone Encounter (Signed)
He passed away.

## 2015-03-18 NOTE — Telephone Encounter (Signed)
Rx was discontinued at the ED on 01/04/2015. Please advise if this refill is appropriate.     KP

## 2016-02-07 IMAGING — CT CT ABD-PELV W/O CM
2 of 4 series · 15 of 46 positions shown, 17 images · non-contrast
Comparison: Ultrasound of the abdomen of 07/31/2014

CLINICAL DATA: Gallstones by ultrasound with one possibly lodged in
the neck of the gallbladder. Somewhat inhomogeneous liver by
ultrasound. Also a 3.1 cm distal abdominal aortic aneurysm noted by
ultrasound. Followup

EXAM:
CT ABDOMEN AND PELVIS WITHOUT CONTRAST
TECHNIQUE: Multidetector CT imaging of the abdomen and pelvis was performed
following the standard protocol without IV contrast.

[Series 2: abd/pelvis 5.0 b31f · axial · 0.82mm/px · z∈[+809,+1259]mm · 12 of 100 slices shown, 14 images]
[im 5/100  soft-tissue]
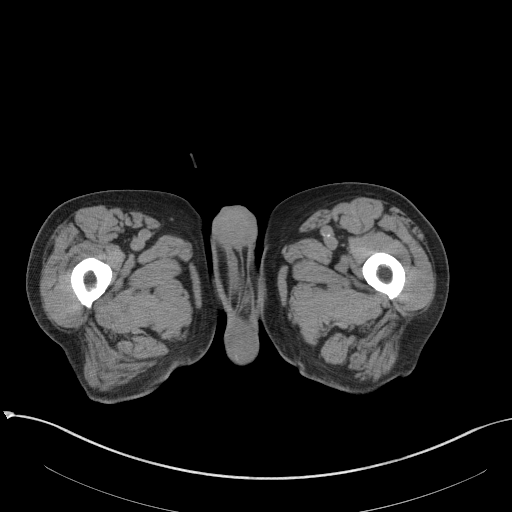
[im 5/100  bone]
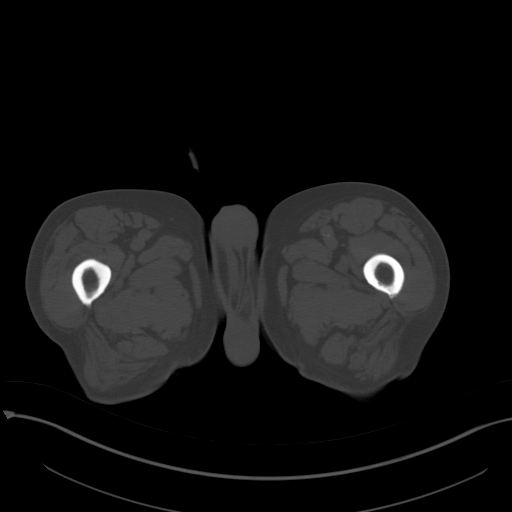
[im 13/100  soft-tissue]
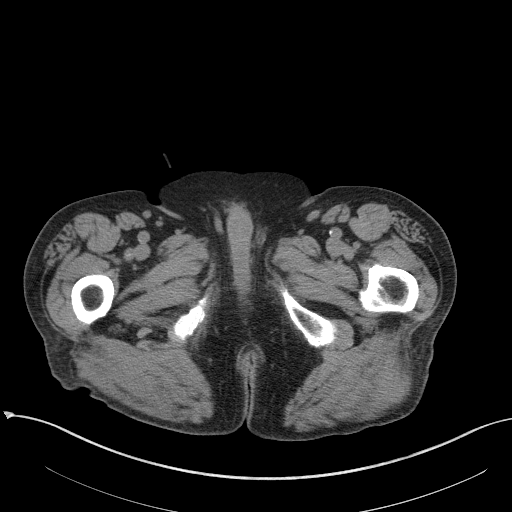
[im 21/100  soft-tissue]
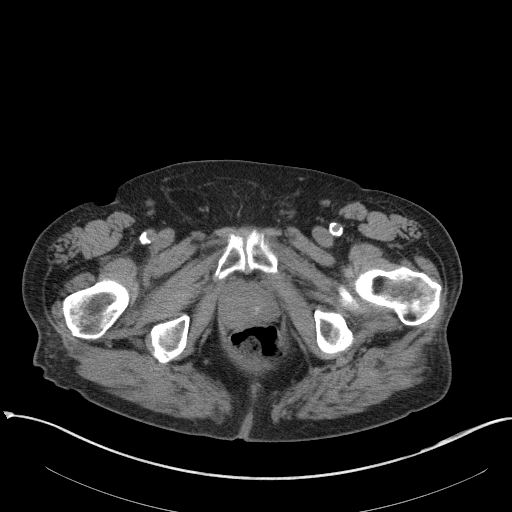
[im 29/100  soft-tissue]
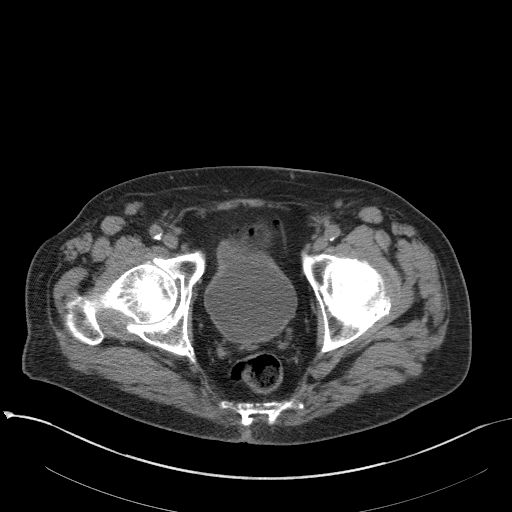
[im 38/100  soft-tissue]
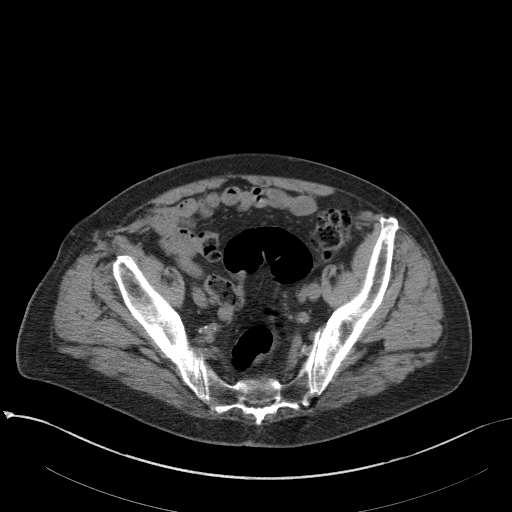
[im 46/100  soft-tissue]
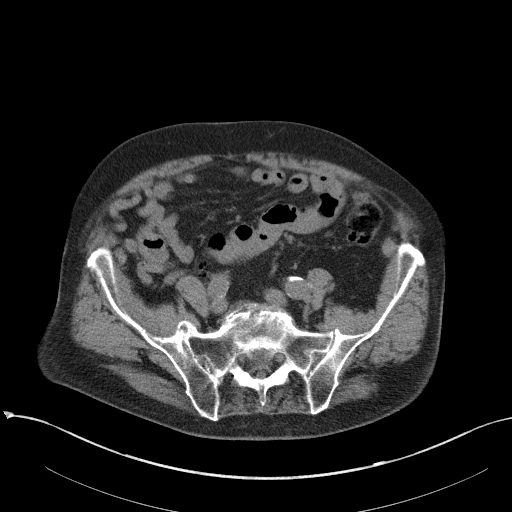
[im 54/100  soft-tissue]
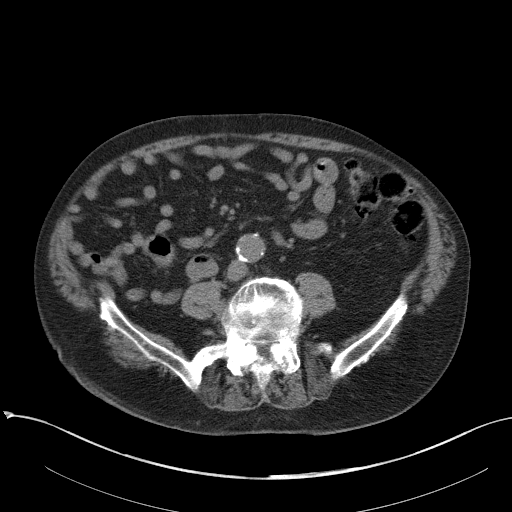
[im 62/100  soft-tissue]
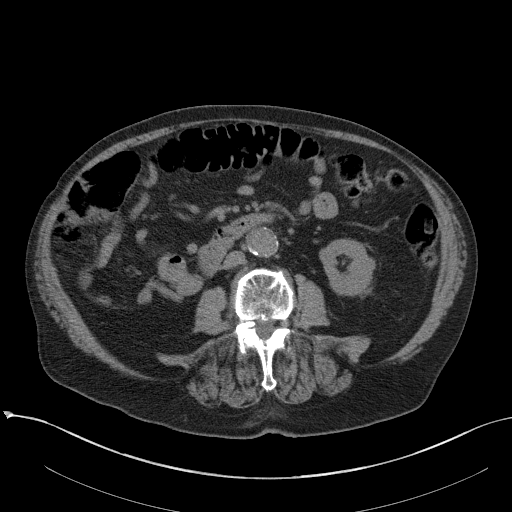
[im 71/100  soft-tissue]
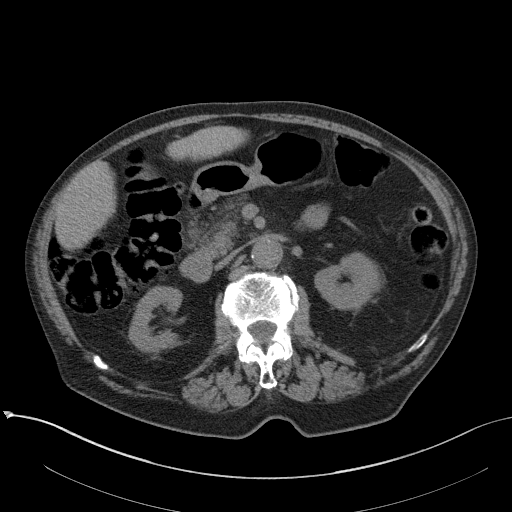
[im 71/100  bone]
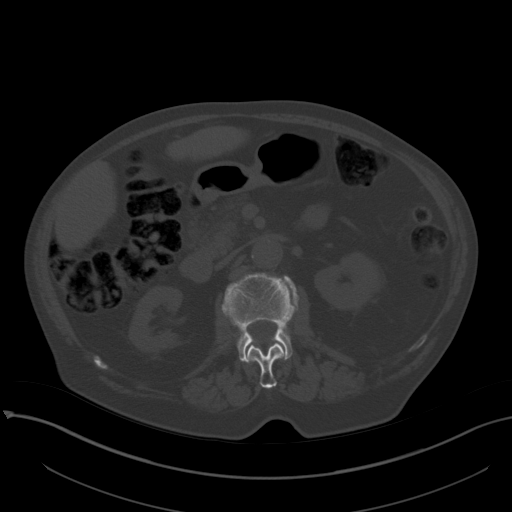
[im 79/100  soft-tissue]
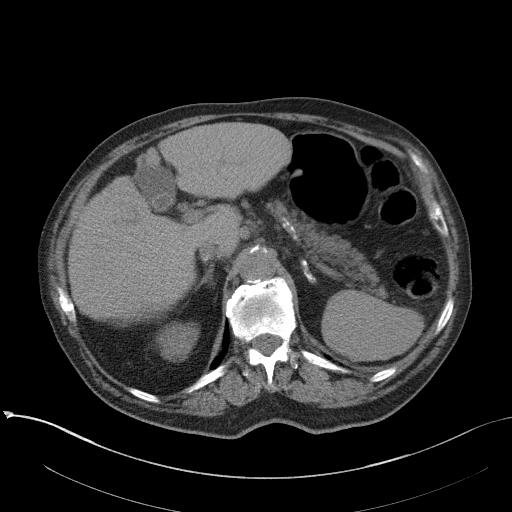
[im 87/100  soft-tissue]
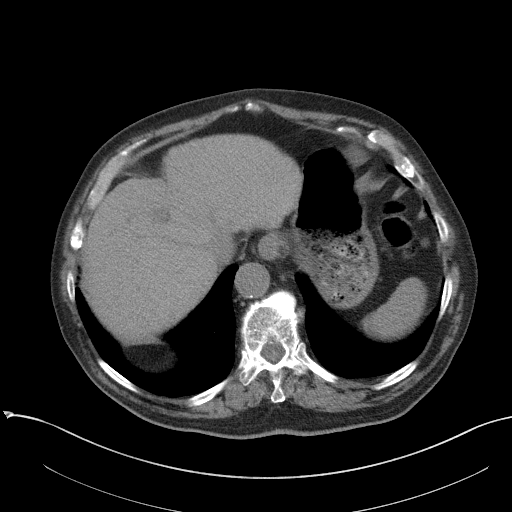
[im 95/100  soft-tissue]
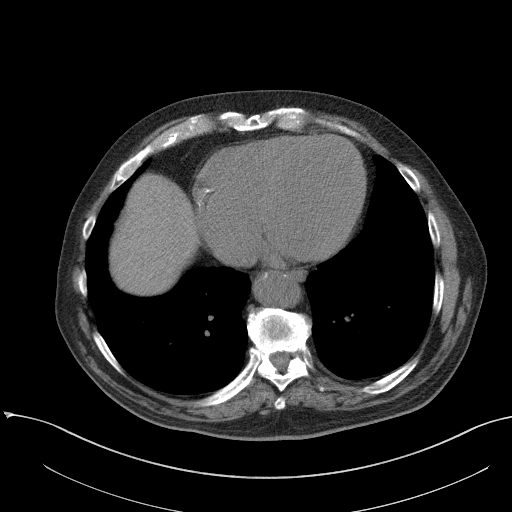

[Series 5: abd/pelvis 3.0 coronal · coronal · 0.87mm/px · 3 of 96 slices shown]
[im 32/96  soft-tissue]
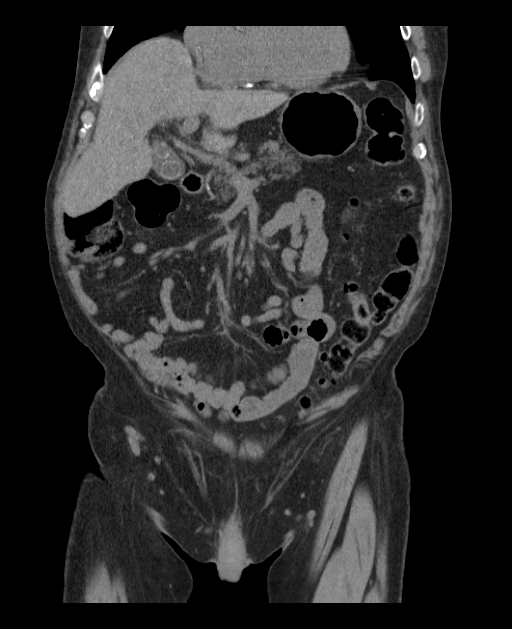
[im 43/96  soft-tissue]
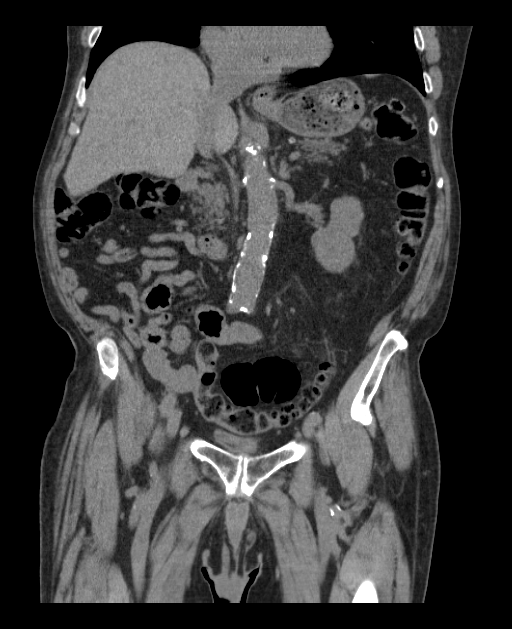
[im 53/96  soft-tissue]
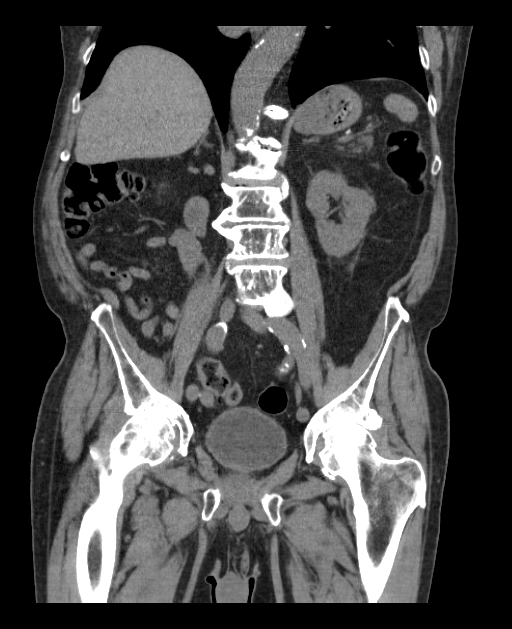

[15 of 46 positions shown; findings below may reference images not displayed]

FINDINGS: The lung bases are clear. The heart is mildly enlarged. There are
faint coronary artery calcifications noted diffusely. The liver is
somewhat inhomogeneous with slightly nodular margins suspicious for
changes of cirrhosis. Clinical correlation is recommended. There are
peripherally calcified gallstones present 1 of which is within the
neck of the gallbladder. However no CT evidence of acute
cholecystitis is seen. The gallbladder wall is normal in thickness
and no pericholecystic fluid is seen. The pancreas is normal in size
and the pancreatic duct is not dilated. The adrenal glands and
spleen are unremarkable. The stomach is moderately distended with
food debris and fluid with no abnormality evident. No renal calculi
are seen and there is no evidence of hydronephrosis or renal mass.
The abdominal aorta demonstrates moderate atheromatous change. By CT
the slight bulge of the distal abdominal aorta measures 2.7 cm in
diameter on image 43. Ectatic abdominal aorta at risk for aneurysm
development. Recommend followup by ultrasound in 5 years. This
recommendation follows ACR consensus guidelines: White Paper of the
ACR Incidental Findings Committee II on Vascular Findings. [HOSPITAL] 4101; [DATE]. No adenopathy is seen.

The urinary bladder is not optimally distended but no abnormality is
seen. The prostate appears normal in size. No fluid is seen within
the pelvis. There are multiple diverticula within the rectosigmoid
colon but no diverticulitis is seen. Diverticula also are noted
scattered throughout the entire colon. The terminal ileum is
unremarkable. The appendix is not definitely seen. There are mild
degenerative changes in both hips as well as throughout the lumbar
spine. Probable degenerative change also is noted throughout the SI
joints. There is slight anterolisthesis of L4 on L5 by approximately
6 mm most likely degenerative in origin. No pars defect is seen.
IMPRESSION: 1. Inhomogeneous liver with nodular contours most consistent with
cirrhosis. Correlate clinically. No focal hepatic abnormality is
seen.
2. At least 2 gallstones 1 of which is in the neck of the
gallbladder but no CT evidence of acute cholecystitis is seen.
3. 2.7 cm distal abdominal aortic aneurysm. Ectatic abdominal aorta
at risk for aneurysm development. Recommend followup by ultrasound
in 5 years. This recommendation follows ACR consensus guidelines:
White Paper of the ACR Incidental Findings Committee II on Vascular
Findings. [HOSPITAL] 4101; [DATE].
4. Multiple colonic diverticula.
5. 6 mm anterolisthesis of L4 on L5 degenerative in origin.
